# Patient Record
Sex: Female | Born: 2000 | Race: Black or African American | Hispanic: No | Marital: Single | Smoking: Never smoker
Health system: Southern US, Community
[De-identification: ages and names within clinical notes are randomized; demographics above are authoritative.]

## PROBLEM LIST (undated history)

## (undated) ENCOUNTER — Inpatient Hospital Stay (HOSPITAL_COMMUNITY): Payer: Self-pay

## (undated) DIAGNOSIS — R519 Headache, unspecified: Secondary | ICD-10-CM

---
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
## 2019-07-07 ENCOUNTER — Ambulatory Visit
Admission: EM | Admit: 2019-07-07 | Discharge: 2019-07-07 | Disposition: A | Discharge status: Home / Self Care | Payer: Managed Care, Other (non HMO) | Attending: Emergency Medicine | Admitting: Emergency Medicine

## 2019-07-07 ENCOUNTER — Encounter: Payer: Self-pay | Admitting: Emergency Medicine

## 2019-07-07 ENCOUNTER — Other Ambulatory Visit: Payer: Self-pay

## 2019-07-07 DIAGNOSIS — B354 Tinea corporis: Secondary | ICD-10-CM | POA: Diagnosis not present

## 2019-07-07 MED ORDER — CICLOPIROX OLAMINE 0.77 % EX CREA: TOPICAL_CREAM | Freq: Two times a day (BID) | CUTANEOUS | 0 refills | Status: DC

## 2019-07-07 NOTE — Discharge Instructions (Addendum)
Prescribed ciclopirox.  Use twice daily until symptoms resolution Use mild soap daily.  Avoid soaps with additives, or perfumes Follow up with PCP Return or go to the ED if you have any new or worsening symptoms such as worsening rash, pain, fever, chills, nausea, vomiting, abdominal pain, etc..Marland Kitchen

## 2019-07-07 NOTE — ED Triage Notes (Signed)
Area to RT forearm that is raised, itching and burning since Saturday. Pt has used cortisone cream with no relief.

## 2019-07-07 NOTE — ED Provider Notes (Signed)
[facility]    CSN: [id number] Arrival date & time: 07/07/19  1050      History   Chief Complaint Chief Complaint  Patient presents with  . Rash    HPI Monique Duran is a 19 y.o. female.   Who presented to the urgent care for complaint of rash on right forearm for the past 2 days.  She denies changes in soaps, detergents, or anyone with similar symptoms.  She localizes the rash to her right forearm.  She describes it as itchy and burning.  She has tried OTC hydrocortisone without relief.  Her symptoms are made worse with heat. denies similar symptoms in the past.  Denies chills, fever, nausea, vomiting, diarrhea  The history is provided by the patient. No language interpreter was used.  Rash   History reviewed. No pertinent past medical history.  There are no problems to display for this patient.   History reviewed. No pertinent surgical history.  OB History   No obstetric history on file.      Home Medications    Prior to Admission medications   Medication Sig Start Date End Date Taking? Authorizing Provider  cyclobenzaprine (FLEXERIL) 10 MG tablet Take 1 tablet (10 mg total) by mouth 2 (two) times daily as needed for muscle spasms. 04/11/18  Yes Robinson, [place] N, PA-C  ciclopirox (LOPROX) 0.77 % cream Apply topically 2 (two) times daily. 07/07/19   Taelon Bendorf, Zachery Dakins, FNP    Family History No family history on file.  Social History Social History   Tobacco Use  . Smoking status: Never Smoker  . Smokeless tobacco: Never Used  Substance Use Topics  . Alcohol use: Never  . Drug use: Never     Allergies   Patient has no known allergies.   Review of Systems Review of Systems  Constitutional: Negative.   Respiratory: Negative.   Cardiovascular: Negative.   Skin: Positive for color change and rash.  All other systems reviewed and are negative.    Physical Exam Triage Vital Signs ED Triage Vitals [07/07/19 1059]  Enc Vitals Group     BP      Pulse      Resp      Temp      Temp src      SpO2      Weight 110 lb (49.9 kg)     Height 5\' 2"  (1.575 m)     Head Circumference      Peak Flow      Pain Score 0     Pain Loc      Pain Edu?      Excl. in GC?    No data found.  Updated Vital Signs BP 124/88 (BP Location: Right Arm)   Pulse (!) 105   Temp 98.4 F (36.9 C) (Oral)   Resp 17   Ht 5\' 2"  (1.575 m)   Wt 110 lb (49.9 kg)   LMP 05/07/2019   SpO2 96%   BMI 20.12 kg/m   Visual Acuity Right Eye Distance:   Left Eye Distance:   Bilateral Distance:    Right Eye Near:   Left Eye Near:    Bilateral Near:     Physical Exam Vitals and nursing note reviewed.  Constitutional:      General: She is not in acute distress.    Appearance: Normal appearance. She is normal weight. She is not ill-appearing, toxic-appearing or diaphoretic.  Cardiovascular:     Rate and Rhythm:  Normal rate and regular rhythm.     Pulses: Normal pulses.     Heart sounds: Normal heart sounds. No murmur. No friction rub. No gallop.   Pulmonary:     Effort: Pulmonary effort is normal. No respiratory distress.     Breath sounds: Normal breath sounds. No stridor. No wheezing, rhonchi or rales.  Chest:     Chest wall: No tenderness.  Skin:    Findings: Rash present. Rash is crusting and vesicular.  Neurological:     Mental Status: She is alert.      UC Treatments / Results  Labs (all labs ordered are listed, but only abnormal results are displayed) Labs Reviewed - No data to display  EKG   Radiology No results found.  Procedures Procedures (including critical care time)  Medications Ordered in UC Medications - No data to display  Initial Impression / Assessment and Plan / UC Course  I have reviewed the triage vital signs and the nursing notes.  Pertinent labs & imaging results that were available during my care of the patient were reviewed by me and considered in my medical decision making (see chart for  details).   Patient is stable at discharge.  Symptom is likely from tinea corporis.  Will prescribe ciclopirox.  Was advised to follow PCP.  Final Clinical Impressions(s) / UC Diagnoses   Final diagnoses:  Tinea corporis     Discharge Instructions     Prescribed ciclopirox.  Use twice daily until symptoms resolution Use mild soap daily.  Avoid soaps with additives, or perfumes Follow up with PCP Return or go to the ED if you have any new or worsening symptoms such as worsening rash, pain, fever, chills, nausea, vomiting, abdominal pain, etc...     ED Prescriptions    Medication Sig Dispense Auth. Provider   ciclopirox (LOPROX) 0.77 % cream Apply topically 2 (two) times daily. 30 g Emerson Monte, FNP     PDMP not reviewed this encounter.   Emerson Monte, FNP 07/07/19 1119

## 2020-01-13 ENCOUNTER — Emergency Department (HOSPITAL_COMMUNITY)
Admission: EM | Admit: 2020-01-13 | Discharge: 2020-01-14 | Disposition: A | Discharge status: Home / Self Care | Payer: Managed Care, Other (non HMO) | Attending: Emergency Medicine | Admitting: Emergency Medicine

## 2020-01-13 ENCOUNTER — Other Ambulatory Visit: Payer: Self-pay

## 2020-01-13 ENCOUNTER — Encounter (HOSPITAL_COMMUNITY): Payer: Self-pay | Admitting: Emergency Medicine

## 2020-01-13 DIAGNOSIS — R6883 Chills (without fever): Secondary | ICD-10-CM | POA: Insufficient documentation

## 2020-01-13 DIAGNOSIS — R519 Headache, unspecified: Secondary | ICD-10-CM | POA: Insufficient documentation

## 2020-01-13 DIAGNOSIS — Z5321 Procedure and treatment not carried out due to patient leaving prior to being seen by health care provider: Secondary | ICD-10-CM | POA: Diagnosis not present

## 2020-01-13 DIAGNOSIS — R630 Anorexia: Secondary | ICD-10-CM | POA: Insufficient documentation

## 2020-01-13 NOTE — ED Triage Notes (Signed)
Pt states she has had headache x 5 days, loss of appetite x 2 days and has had chills at home. Hx of migraines. Alert and oriented.

## 2020-02-06 NOTE — L&D Delivery Note (Signed)
Delivery Note Patient was admitted with spontaneous rupture of membranes.  She was started on pitocin.  She entered active labor and quickly progressed to 10 cm.  She pushed well for 45 minutes.  At 8:49 AM a viable female was delivered via Vaginal, Spontaneous (Presentation: Left Occiput Anterior).  APGAR: 9, 9; weight 5 lb 9.8 oz (2546 g).   Placenta status: Spontaneous, Intact.  Cord: 3 vessels with the following complications: None.  Cord pH: n/a  Anesthesia: Epidural Episiotomy: None Lacerations: Vaginal Suture Repair: 2.0 vicryl rapide repaired with figure of eight Est. Blood Loss (mL): 50  At approximately 0805, patient was noted to have a temperature of 100.22F as well as new onset fetal and maternal tachycardia.  She received 1gm acetaminophen.  Repeat temperature 30 minutes later was still elevated.  She received 2gm of ampicillin for chorioamnionitis, but did not receive gentamicin due to delivery shortly after diagnosis.    Mom to postpartum.  Baby to Couplet care / Skin to Skin.  Mariano Doshi GEFFEL Marguetta Windish 11/23/2020, 10:25 AM

## 2020-05-04 LAB — OB RESULTS CONSOLE GC/CHLAMYDIA
Chlamydia: NEGATIVE
Gonorrhea: NEGATIVE

## 2020-05-04 LAB — OB RESULTS CONSOLE ABO/RH: RH Type: POSITIVE

## 2020-05-04 LAB — OB RESULTS CONSOLE HIV ANTIBODY (ROUTINE TESTING): HIV: NONREACTIVE

## 2020-05-04 LAB — OB RESULTS CONSOLE HEPATITIS B SURFACE ANTIGEN: Hepatitis B Surface Ag: NEGATIVE

## 2020-05-04 LAB — OB RESULTS CONSOLE VARICELLA ZOSTER ANTIBODY, IGG: Varicella: IMMUNE

## 2020-05-04 LAB — OB RESULTS CONSOLE ANTIBODY SCREEN: Antibody Screen: NEGATIVE

## 2020-05-04 LAB — OB RESULTS CONSOLE RPR: RPR: NONREACTIVE

## 2020-05-04 LAB — OB RESULTS CONSOLE RUBELLA ANTIBODY, IGM: Rubella: IMMUNE

## 2020-05-11 ENCOUNTER — Telehealth: Payer: Self-pay | Admitting: Physician Assistant

## 2020-05-11 NOTE — Telephone Encounter (Signed)
Received a new hem referral from Dr. Mindi Slicker for abnormal blood chemistry. Ms. Monique Duran has been cld and scheduled to seeIrene on 4/11 at 9am. Pt aware to arrive 20 minutes early.

## 2020-05-16 ENCOUNTER — Other Ambulatory Visit: Payer: Self-pay

## 2020-05-16 ENCOUNTER — Inpatient Hospital Stay: Payer: Managed Care, Other (non HMO) | Attending: Physician Assistant

## 2020-05-16 ENCOUNTER — Telehealth: Payer: Self-pay | Admitting: Physician Assistant

## 2020-05-16 ENCOUNTER — Encounter: Payer: Self-pay | Admitting: Physician Assistant

## 2020-05-16 ENCOUNTER — Inpatient Hospital Stay (HOSPITAL_BASED_OUTPATIENT_CLINIC_OR_DEPARTMENT_OTHER): Payer: Managed Care, Other (non HMO) | Admitting: Physician Assistant

## 2020-05-16 VITALS — BP 117/67 | HR 73 | Temp 97.9°F | Resp 15 | Ht 63.0 in | Wt 108.7 lb

## 2020-05-16 DIAGNOSIS — O26891 Other specified pregnancy related conditions, first trimester: Secondary | ICD-10-CM

## 2020-05-16 DIAGNOSIS — Z3A11 11 weeks gestation of pregnancy: Secondary | ICD-10-CM

## 2020-05-16 DIAGNOSIS — D563 Thalassemia minor: Secondary | ICD-10-CM

## 2020-05-16 DIAGNOSIS — K068 Other specified disorders of gingiva and edentulous alveolar ridge: Secondary | ICD-10-CM | POA: Insufficient documentation

## 2020-05-16 DIAGNOSIS — D582 Other hemoglobinopathies: Secondary | ICD-10-CM

## 2020-05-16 LAB — PROTIME-INR
INR: 1.1 (ref 0.8–1.2)
Prothrombin Time: 13.3 seconds (ref 11.4–15.2)

## 2020-05-16 LAB — APTT: aPTT: 30 seconds (ref 24–36)

## 2020-05-16 NOTE — Telephone Encounter (Signed)
I called Monique Duran to reviewed the lab results from today, 05/16/2020.  PT/INR and PTT levels were all within normal limits.  No further work-up is recommended at this time.  Advised patient to try a soft bristle toothbrush and follow-up with dentist to evaluate for gingivitis.   Patient expressed understanding and satisfaction with the plan provided.

## 2020-05-16 NOTE — Progress Notes (Signed)
[facility] [phone]   [phone]  INITIAL CONSULT NOTE  Patient Care Team: Patient, No Pcp Per (Inactive) as PCP - General (General Practice)  Hematological/Oncological History 1) 05/09/2020: Labs from OG/GYN, DR. Cecilia Banga.  -Hgb F 0.0%, Hgb A 96.6%, Hgb A2 3.4% (H), Hgb S 0.0% -WBC 6.6, Hgb 12.7, MCV 89, Plt 356  2) 05/16/2020: Establish care with [name] PA-C  CHIEF COMPLAINTS/PURPOSE OF CONSULTATION:  "Beta Thalassemia Minor"  HISTORY OF PRESENTING ILLNESS:  Monique Duran 20 y.o. 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 Patient is unaccompanied for this visit.  Patient reports that she is fatigued but attributes this to her pregnancy, working full-time and taking evening classes.  She does take a nap daily with improvement of her energy levels.  Patient reports that her appetite varies but denies any significant weight changes.  She endorses intermittent episodes of nausea without vomiting.  Patient denies any bowel habit changes or abdominal pain.  Patient denies any hematochezia or melena.  Over the last 3 to 4 months, patient reports gum bleeding after brushing.  She has not gone to the dentist for over a year due to COVID pandemic.  She denies any dental pain or signs of infection.  Patient reports mild shortness of breath with exertion but none at rest.  Patient denies any fevers, chills, night sweats, chest pain or cough.  She has no other complaints.  Rest of the 10 point ROS is below.   MEDICAL HISTORY:  History reviewed. No pertinent past medical history.  SURGICAL HISTORY: History reviewed. No pertinent surgical history.  SOCIAL HISTORY: Social History   Socioeconomic History  . Marital status: Single    Spouse name: Not on file  . Number of children: Not on file  . Years of education: Not on file  . Highest education level: Not on file  Occupational History  .  Not on file  Tobacco Use  . Smoking status: Never Smoker  . Smokeless tobacco: Never Used  Substance and Sexual Activity  . Alcohol use: Never  . Drug use: Never  . Sexual activity: Not on file  Other Topics Concern  . Not on file  Social History Narrative  . Not on file   Social Determinants of Health   Financial Resource Strain: Not on file  Food Insecurity: Not on file  Transportation Needs: Not on file  Physical Activity: Not on file  Stress: Not on file  Social Connections: Not on file  Intimate Partner Violence: Not on file    FAMILY HISTORY: Family History  Problem Relation Age of Onset  . Ovarian cancer Mother   . Cancer Maternal Grandmother     ALLERGIES:  has No Known Allergies.  MEDICATIONS:  Current Outpatient Medications  Medication Sig Dispense Refill  . Prenatal MV & Min w/FA-DHA (PRENATAL GUMMIES PO) Take 2 Doses by mouth daily.     No current facility-administered medications for this visit.    REVIEW OF SYSTEMS:   Constitutional: ( - ) fevers, ( - )  chills , ( - ) night sweats Eyes: ( - ) blurriness of vision, ( - ) double vision, ( - ) watery eyes Ears, nose, mouth, throat, and face: ( - ) mucositis, ( - ) sore throat Respiratory: ( - ) cough, ( - ) dyspnea, ( - ) wheezes Cardiovascular: ( - ) palpitation, ( - ) chest discomfort, ( - ) lower  extremity swelling Gastrointestinal:  ( + ) nausea, ( - ) heartburn, ( - ) change in bowel habits Skin: ( - ) abnormal skin rashes Lymphatics: ( - ) new lymphadenopathy, ( - ) easy bruising Neurological: ( - ) numbness, ( - ) tingling, ( - ) new weaknesses Behavioral/Psych: ( - ) mood change, ( - ) new changes  All other systems were reviewed with the patient and are negative.  PHYSICAL EXAMINATION: ECOG PERFORMANCE STATUS: 0 - Asymptomatic  Vitals:   05/16/20 0900  BP: 117/67  Pulse: 73  Resp: 15  Temp: 97.9 F (36.6 C)  SpO2: 100%   Filed Weights   05/16/20 0900  Weight: 108 lb 11.2 oz  (49.3 kg)    GENERAL: well appearing african [place] female in NAD  SKIN: skin color, texture, turgor are normal, no rashes or significant lesions EYES: conjunctiva are pink and non-injected, sclera clear OROPHARYNX: no exudate, no erythema; lips, buccal mucosa, and tongue normal  NECK: supple, non-tender LYMPH:  no palpable lymphadenopathy in the cervical, axillary or supraclavicular lymph nodes.  LUNGS: clear to auscultation and percussion with normal breathing effort HEART: regular rate & rhythm and no murmurs and no lower extremity edema ABDOMEN: soft, non-tender, non-distended, normal bowel sounds Musculoskeletal: no cyanosis of digits and no clubbing  PSYCH: alert & oriented x 3, fluent speech NEURO: no focal motor/sensory deficits  LABORATORY DATA:  I have reviewed the data as listed No flowsheet data found.  No flowsheet data found.  ASSESSMENT & PLAN Monique Duran is a 20 y.o. 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 Patient is asymptomatic and most recent lab work from 05/09/2020 doesn't indicate anemia. Patient denies history of anemia or other blood disorders. I explained that no further intervention is recommended at this time and patient will likely be asymptomatic for the rest of her life. Since levels are borderline, patient can repeat hemoglobin electrophoresis 3 months after delivery to confirm beta thalassemia trait. Baby's father is not aware of having a thalassemia disorder. I recommend testing the baby to evaluate for thalassemia after delivery. In addition, consider testing the baby's father for future pregnancies.  #Borderline Hemoglobin A2 levels: --Possible beta thalassemia trait. No further workup at this time. Recommend repeat testing 3 months after delivery. If confirmed beta thalassemia trait, consider testing baby after delivery. Recommend testing baby's father for  future pregnancies.  --RTC as needed.   #Gum bleeding --Differentials includes gingivitis, trauma from toothbrush and/or bleeding disorder. --Labs today to check PT/INR and PTT levels today. Follow up by phone with results. --Advised to follow up with dentist and try a soft bristle toothbrush.     Orders Placed This Encounter  Procedures  . Protime-INR    Standing Status:   Future    Standing Expiration Date:   05/16/2021  . APTT    Standing Status:   Future    Standing Expiration Date:   05/16/2021    All questions were answered. The patient knows to call the clinic with any problems, questions or concerns.  A total of more than 60 minutes were spent on this encounter and over half of that time was spent on counseling and coordination of care as outlined above.    Briant Cedar, PA-C Department of Hematology/Oncology [facility] Phone: [phone]  Patient was seen with Dr. Leonides Schanz  I have read the above  note and personally examined the patient. I agree with the assessment and plan as noted above.  Briefly Mrs. Choung is a 20 year old female currently [redacted] weeks pregnant who presents for evaluation of an elevated hemoglobin A2 on a hemoglobin electrophoresis.  She was noted to have a hemoglobin A2 of approximately 3.4% which is elevated above the reference range.  This is suggestive of a possible beta thalassemia trait.  It is unlikely that the pregnancy has altered the hemoglobin A2, however we would recommend repeat evaluation approximately 3 months after delivery.  In the event that this is genuine there is no specific intervention is required for this patient, other than we would recommend genetic counseling prior to having another child.  It may also be worthwhile to have the child checked for beta thalassemia trait after birth if confirmed in the mother.  The patient does not have any other clinical signs concerning for beta thalassemia  trait with no decrease in the MCV or decreased hemoglobin.  No routine follow-up is required in our clinic, however we are happy to see her back in the event that she were to have new or other concerning findings on her blood work.   Ulysees Barns, MD Department of Hematology/Oncology [facility] at [facility] Phone: [phone] Pager: [phone] Email: [email or web address]

## 2020-07-08 ENCOUNTER — Inpatient Hospital Stay (HOSPITAL_COMMUNITY)
Admission: AD | Admit: 2020-07-08 | Discharge: 2020-07-08 | Disposition: A | Discharge status: Home / Self Care | Payer: Managed Care, Other (non HMO) | Attending: Obstetrics and Gynecology | Admitting: Obstetrics and Gynecology

## 2020-07-08 ENCOUNTER — Encounter (HOSPITAL_COMMUNITY): Payer: Self-pay

## 2020-07-08 ENCOUNTER — Inpatient Hospital Stay (HOSPITAL_BASED_OUTPATIENT_CLINIC_OR_DEPARTMENT_OTHER): Payer: Managed Care, Other (non HMO)

## 2020-07-08 ENCOUNTER — Other Ambulatory Visit: Payer: Self-pay

## 2020-07-08 DIAGNOSIS — Z3A18 18 weeks gestation of pregnancy: Secondary | ICD-10-CM | POA: Diagnosis not present

## 2020-07-08 DIAGNOSIS — O9A312 Physical abuse complicating pregnancy, second trimester: Secondary | ICD-10-CM

## 2020-07-08 DIAGNOSIS — O9A212 Injury, poisoning and certain other consequences of external causes complicating pregnancy, second trimester: Secondary | ICD-10-CM | POA: Diagnosis not present

## 2020-07-08 DIAGNOSIS — M549 Dorsalgia, unspecified: Secondary | ICD-10-CM | POA: Diagnosis not present

## 2020-07-08 DIAGNOSIS — R109 Unspecified abdominal pain: Secondary | ICD-10-CM | POA: Diagnosis not present

## 2020-07-08 DIAGNOSIS — Z6981 Encounter for mental health services for victim of other abuse: Secondary | ICD-10-CM

## 2020-07-08 DIAGNOSIS — R1031 Right lower quadrant pain: Secondary | ICD-10-CM | POA: Diagnosis not present

## 2020-07-08 DIAGNOSIS — T7491XA Unspecified adult maltreatment, confirmed, initial encounter: Secondary | ICD-10-CM

## 2020-07-08 DIAGNOSIS — O26892 Other specified pregnancy related conditions, second trimester: Secondary | ICD-10-CM | POA: Diagnosis not present

## 2020-07-08 HISTORY — DX: Headache, unspecified: R51.9

## 2020-07-08 LAB — WET PREP, GENITAL
Sperm: NONE SEEN
Trich, Wet Prep: NONE SEEN
Yeast Wet Prep HPF POC: NONE SEEN

## 2020-07-08 LAB — CBC
HCT: 31.2 % — ABNORMAL LOW (ref 36.0–46.0)
Hemoglobin: 10.5 g/dL — ABNORMAL LOW (ref 12.0–15.0)
MCH: 30.7 pg (ref 26.0–34.0)
MCHC: 33.7 g/dL (ref 30.0–36.0)
MCV: 91.2 fL (ref 80.0–100.0)
Platelets: 266 10*3/uL (ref 150–400)
RBC: 3.42 MIL/uL — ABNORMAL LOW (ref 3.87–5.11)
RDW: 13.1 % (ref 11.5–15.5)
WBC: 6.4 10*3/uL (ref 4.0–10.5)
nRBC: 0 % (ref 0.0–0.2)

## 2020-07-08 LAB — URINALYSIS, ROUTINE W REFLEX MICROSCOPIC
Bilirubin Urine: NEGATIVE
Glucose, UA: NEGATIVE mg/dL
Hgb urine dipstick: NEGATIVE
Ketones, ur: 5 mg/dL — AB
Leukocytes,Ua: NEGATIVE
Nitrite: NEGATIVE
Protein, ur: NEGATIVE mg/dL
Specific Gravity, Urine: 1.025 (ref 1.005–1.030)
pH: 5 (ref 5.0–8.0)

## 2020-07-08 MED ORDER — ACETAMINOPHEN 500 MG PO TABS
1000.0000 mg | ORAL_TABLET | Freq: Once | ORAL | Status: AC
  Administered 2020-07-08: 1000 mg via ORAL
  Filled 2020-07-08: qty 2

## 2020-07-08 NOTE — MAU Note (Signed)
.  Monique Duran is a 20 y.o. at [redacted]w[redacted]d here in MAU reporting: Abdominal pain that is her LRQ. States that it started this morning when she woke up. Denies VB or LOF. The pain feels better when she sits down but returns to a 7 when she stands,   Pain score: 7   Lab orders placed from triage: UA
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
## 2020-07-08 NOTE — Discharge Instructions (Signed)
Abdominal Pain During Pregnancy Belly (abdominal) pain is common during pregnancy. There are many possible causes. Some causes are more serious than others. Sometimes the cause is not known. Always tell your doctor if you have belly pain. Follow these instructions at home:  Do not have sex or put anything in your vagina until your pain goes away completely.  Get plenty of rest until your pain gets better.  Drink enough fluid to keep your pee (urine) pale yellow.  Take over-the-counter and prescription medicines only as told by your doctor.  Keep all follow-up visits.   Contact a doctor if:  You keep having pain after resting.  Your pain gets worse after resting.  You have lower belly pain that: ? Comes and goes at regular times. ? Spreads to your back. ? Feels like menstrual cramps.  You have pain or burning when you pee (urinate). Get help right away if:  You have a fever or chills.  You feel like it is hard to breathe.  You have bleeding from your vagina.  You are leaking fluid or tissue from your vagina.  You vomit for more than 24 hours.  You have watery poop (diarrhea) for more than 24 hours.  Your baby is moving less than usual.  You feel very weak or faint.  You have very bad pain in your upper belly. Summary  Belly pain is common during pregnancy. There are many possible causes.  If you have belly pain during pregnancy, tell your doctor right away.  Keep all follow-up visits. This information is not intended to replace advice given to you by your health care provider. Make sure you discuss any questions you have with your health care provider. Document Revised: 10/06/2019 Document Reviewed: 10/06/2019 Elsevier Patient Education  2021 Elsevier Inc.  

## 2020-07-11 LAB — GC/CHLAMYDIA PROBE AMP (~~LOC~~) NOT AT ARMC
Chlamydia: NEGATIVE
Comment: NEGATIVE
Comment: NORMAL
Neisseria Gonorrhea: NEGATIVE

## 2020-11-09 LAB — OB RESULTS CONSOLE GBS: GBS: NEGATIVE

## 2020-11-22 ENCOUNTER — Inpatient Hospital Stay (HOSPITAL_COMMUNITY)
Admission: AD | Admit: 2020-11-22 | Discharge: 2020-11-25 | DRG: 805 | Disposition: A | Discharge status: Home / Self Care | Payer: Managed Care, Other (non HMO) | Attending: Obstetrics | Admitting: Obstetrics

## 2020-11-22 ENCOUNTER — Other Ambulatory Visit: Payer: Self-pay

## 2020-11-22 ENCOUNTER — Encounter (HOSPITAL_COMMUNITY): Payer: Self-pay | Admitting: Obstetrics and Gynecology

## 2020-11-22 DIAGNOSIS — Z20822 Contact with and (suspected) exposure to covid-19: Secondary | ICD-10-CM | POA: Diagnosis present

## 2020-11-22 DIAGNOSIS — R000 Tachycardia, unspecified: Secondary | ICD-10-CM | POA: Diagnosis present

## 2020-11-22 DIAGNOSIS — Z3A38 38 weeks gestation of pregnancy: Secondary | ICD-10-CM | POA: Diagnosis not present

## 2020-11-22 DIAGNOSIS — O365930 Maternal care for other known or suspected poor fetal growth, third trimester, not applicable or unspecified: Secondary | ICD-10-CM | POA: Diagnosis present

## 2020-11-22 DIAGNOSIS — O411230 Chorioamnionitis, third trimester, not applicable or unspecified: Secondary | ICD-10-CM | POA: Diagnosis present

## 2020-11-22 DIAGNOSIS — O99892 Other specified diseases and conditions complicating childbirth: Secondary | ICD-10-CM | POA: Diagnosis present

## 2020-11-22 DIAGNOSIS — O26893 Other specified pregnancy related conditions, third trimester: Secondary | ICD-10-CM | POA: Diagnosis present

## 2020-11-22 LAB — CBC
HCT: 34.5 % — ABNORMAL LOW (ref 36.0–46.0)
Hemoglobin: 11.5 g/dL — ABNORMAL LOW (ref 12.0–15.0)
MCH: 29.7 pg (ref 26.0–34.0)
MCHC: 33.3 g/dL (ref 30.0–36.0)
MCV: 89.1 fL (ref 80.0–100.0)
Platelets: 329 10*3/uL (ref 150–400)
RBC: 3.87 MIL/uL (ref 3.87–5.11)
RDW: 13.7 % (ref 11.5–15.5)
WBC: 8.3 10*3/uL (ref 4.0–10.5)
nRBC: 0 % (ref 0.0–0.2)

## 2020-11-22 MED ORDER — ACETAMINOPHEN 325 MG PO TABS: 650.0000 mg | ORAL_TABLET | ORAL | Status: DC | PRN

## 2020-11-22 MED ORDER — LIDOCAINE HCL (PF) 1 % IJ SOLN: 30.0000 mL | INTRAMUSCULAR | Status: DC | PRN

## 2020-11-22 MED ORDER — OXYTOCIN-SODIUM CHLORIDE 30-0.9 UT/500ML-% IV SOLN: 2.5000 [IU]/h | INTRAVENOUS | Status: DC

## 2020-11-22 MED ORDER — OXYCODONE-ACETAMINOPHEN 5-325 MG PO TABS
1.0000 | ORAL_TABLET | ORAL | Status: DC | PRN
Start: 2020-11-22 — End: 2020-11-23

## 2020-11-22 MED ORDER — OXYTOCIN BOLUS FROM INFUSION
333.0000 mL | Freq: Once | INTRAVENOUS | Status: AC
  Administered 2020-11-23: 333 mL via INTRAVENOUS

## 2020-11-22 MED ORDER — FLEET ENEMA 7-19 GM/118ML RE ENEM: 1.0000 | ENEMA | RECTAL | Status: DC | PRN

## 2020-11-22 MED ORDER — LACTATED RINGERS IV SOLN
500.0000 mL | INTRAVENOUS | Status: DC | PRN
Start: 2020-11-22 — End: 2020-11-23
  Administered 2020-11-23: 500 mL via INTRAVENOUS

## 2020-11-22 MED ORDER — LACTATED RINGERS IV SOLN: INTRAVENOUS | Status: DC

## 2020-11-22 MED ORDER — ONDANSETRON HCL 4 MG/2ML IJ SOLN
4.0000 mg | Freq: Four times a day (QID) | INTRAMUSCULAR | Status: DC | PRN
  Administered 2020-11-23: 4 mg via INTRAVENOUS
  Filled 2020-11-22: qty 2

## 2020-11-22 MED ORDER — OXYCODONE-ACETAMINOPHEN 5-325 MG PO TABS: 2.0000 | ORAL_TABLET | ORAL | Status: DC | PRN

## 2020-11-22 MED ORDER — SOD CITRATE-CITRIC ACID 500-334 MG/5ML PO SOLN: 30.0000 mL | ORAL | Status: DC | PRN
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
## 2020-11-22 NOTE — MAU Note (Signed)
Pt reported leakage of fluid @2126  today, clear in color, no odor, denies vaginal bleeding, + FM, safety maintained.

## 2020-11-23 ENCOUNTER — Inpatient Hospital Stay (HOSPITAL_COMMUNITY): Payer: Managed Care, Other (non HMO) | Admitting: Anesthesiology

## 2020-11-23 ENCOUNTER — Encounter (HOSPITAL_COMMUNITY): Payer: Self-pay | Admitting: Obstetrics and Gynecology

## 2020-11-23 LAB — RESP PANEL BY RT-PCR (FLU A&B, COVID) ARPGX2
Influenza A by PCR: NEGATIVE
Influenza B by PCR: NEGATIVE
SARS Coronavirus 2 by RT PCR: NEGATIVE

## 2020-11-23 LAB — TYPE AND SCREEN
ABO/RH(D): A POS
Antibody Screen: NEGATIVE

## 2020-11-23 LAB — RPR: RPR Ser Ql: NONREACTIVE

## 2020-11-23 MED ORDER — TETANUS-DIPHTH-ACELL PERTUSSIS 5-2.5-18.5 LF-MCG/0.5 IM SUSY: 0.5000 mL | PREFILLED_SYRINGE | Freq: Once | INTRAMUSCULAR | Status: DC

## 2020-11-23 MED ORDER — IBUPROFEN 600 MG PO TABS
600.0000 mg | ORAL_TABLET | Freq: Four times a day (QID) | ORAL | Status: DC
  Administered 2020-11-23 – 2020-11-25 (×9): 600 mg via ORAL
  Filled 2020-11-23 (×9): qty 1

## 2020-11-23 MED ORDER — ACETAMINOPHEN 325 MG PO TABS: 650.0000 mg | ORAL_TABLET | ORAL | Status: DC | PRN

## 2020-11-23 MED ORDER — FENTANYL-BUPIVACAINE-NACL 0.5-0.125-0.9 MG/250ML-% EP SOLN
12.0000 mL/h | EPIDURAL | Status: DC | PRN
  Administered 2020-11-23: 12 mL/h via EPIDURAL
  Filled 2020-11-23: qty 250

## 2020-11-23 MED ORDER — LACTATED RINGERS IV SOLN: 500.0000 mL | Freq: Once | INTRAVENOUS | Status: DC

## 2020-11-23 MED ORDER — SENNOSIDES-DOCUSATE SODIUM 8.6-50 MG PO TABS
2.0000 | ORAL_TABLET | ORAL | Status: DC
  Administered 2020-11-23 – 2020-11-25 (×3): 2 via ORAL
  Filled 2020-11-23 (×3): qty 2

## 2020-11-23 MED ORDER — ACETAMINOPHEN 500 MG PO TABS
1000.0000 mg | ORAL_TABLET | Freq: Once | ORAL | Status: AC
  Administered 2020-11-23: 1000 mg via ORAL
  Filled 2020-11-23: qty 2

## 2020-11-23 MED ORDER — OXYTOCIN-SODIUM CHLORIDE 30-0.9 UT/500ML-% IV SOLN
1.0000 m[IU]/min | INTRAVENOUS | Status: DC
  Administered 2020-11-23: 2 m[IU]/min via INTRAVENOUS
  Filled 2020-11-23: qty 500

## 2020-11-23 MED ORDER — PHENYLEPHRINE 40 MCG/ML (10ML) SYRINGE FOR IV PUSH (FOR BLOOD PRESSURE SUPPORT)
80.0000 ug | PREFILLED_SYRINGE | INTRAVENOUS | Status: DC | PRN
  Filled 2020-11-23: qty 10

## 2020-11-23 MED ORDER — OXYCODONE HCL 5 MG PO TABS: 5.0000 mg | ORAL_TABLET | ORAL | Status: DC | PRN

## 2020-11-23 MED ORDER — DIPHENHYDRAMINE HCL 50 MG/ML IJ SOLN
12.5000 mg | INTRAMUSCULAR | Status: DC | PRN
Start: 2020-11-23 — End: 2020-11-23

## 2020-11-23 MED ORDER — BENZOCAINE-MENTHOL 20-0.5 % EX AERO: 1.0000 "application " | INHALATION_SPRAY | CUTANEOUS | Status: DC | PRN

## 2020-11-23 MED ORDER — TERBUTALINE SULFATE 1 MG/ML IJ SOLN: 0.2500 mg | Freq: Once | INTRAMUSCULAR | Status: DC | PRN

## 2020-11-23 MED ORDER — SODIUM CHLORIDE 0.9 % IV SOLN
2.0000 g | Freq: Once | INTRAVENOUS | Status: AC
  Administered 2020-11-23: 2 g via INTRAVENOUS
  Filled 2020-11-23: qty 2000

## 2020-11-23 MED ORDER — WITCH HAZEL-GLYCERIN EX PADS: 1.0000 "application " | MEDICATED_PAD | CUTANEOUS | Status: DC | PRN

## 2020-11-23 MED ORDER — LIDOCAINE HCL (PF) 1 % IJ SOLN
INTRAMUSCULAR | Status: DC | PRN
  Administered 2020-11-23: 10 mL via EPIDURAL

## 2020-11-23 MED ORDER — BUTORPHANOL TARTRATE 1 MG/ML IJ SOLN
1.0000 mg | INTRAMUSCULAR | Status: DC | PRN
  Administered 2020-11-23: 1 mg via INTRAVENOUS
  Filled 2020-11-23: qty 1

## 2020-11-23 MED ORDER — SIMETHICONE 80 MG PO CHEW: 80.0000 mg | CHEWABLE_TABLET | ORAL | Status: DC | PRN

## 2020-11-23 MED ORDER — DIPHENHYDRAMINE HCL 25 MG PO CAPS: 25.0000 mg | ORAL_CAPSULE | Freq: Four times a day (QID) | ORAL | Status: DC | PRN

## 2020-11-23 MED ORDER — OXYCODONE HCL 5 MG PO TABS: 10.0000 mg | ORAL_TABLET | ORAL | Status: DC | PRN

## 2020-11-23 MED ORDER — EPHEDRINE 5 MG/ML INJ: 10.0000 mg | INTRAVENOUS | Status: DC | PRN

## 2020-11-23 MED ORDER — COCONUT OIL OIL: 1.0000 "application " | TOPICAL_OIL | Status: DC | PRN

## 2020-11-23 MED ORDER — DIBUCAINE (PERIANAL) 1 % EX OINT: 1.0000 "application " | TOPICAL_OINTMENT | CUTANEOUS | Status: DC | PRN

## 2020-11-23 MED ORDER — PRENATAL MULTIVITAMIN CH
1.0000 | ORAL_TABLET | Freq: Every day | ORAL | Status: DC
  Administered 2020-11-23 – 2020-11-25 (×3): 1 via ORAL
  Filled 2020-11-23 (×3): qty 1

## 2020-11-23 MED ORDER — ONDANSETRON HCL 4 MG PO TABS: 4.0000 mg | ORAL_TABLET | ORAL | Status: DC | PRN

## 2020-11-23 MED ORDER — OXYTOCIN-SODIUM CHLORIDE 30-0.9 UT/500ML-% IV SOLN: 1.0000 m[IU]/min | INTRAVENOUS | Status: DC

## 2020-11-23 MED ORDER — PHENYLEPHRINE 40 MCG/ML (10ML) SYRINGE FOR IV PUSH (FOR BLOOD PRESSURE SUPPORT)
80.0000 ug | PREFILLED_SYRINGE | INTRAVENOUS | Status: DC | PRN
  Administered 2020-11-23: 80 ug via INTRAVENOUS
  Filled 2020-11-23: qty 10

## 2020-11-23 MED ORDER — ONDANSETRON HCL 4 MG/2ML IJ SOLN: 4.0000 mg | INTRAMUSCULAR | Status: DC | PRN

## 2020-11-23 NOTE — Lactation Note (Signed)
This note was copied from a baby's chart. Lactation Consultation Note  Patient Name: Monique Duran Date: 11/23/2020 Reason for consult: Initial assessment;1st time breastfeeding;Early term 37-38.6wks;Infant < 6lbs;Other (Comment) (Low blood sugar, less than 6 lbs at birth.) Age:21 hours LC entered the room, mom was doing skin to skin with infant. Mom's current feeding choice is breast and supplementing with donor breast milk. Mom has flat nipples, mom will attempt latch infant without NS for now, but knows how to use it, if needed.  LC did not observe latch due mom recently breastfeeding for 10 minutes  and  afterwards was giving infant 10 mls of donor breast milk  with yellow slow flow bottle nipple less than 1 hour ago prior to [facility] entering the room. LC set mom up with DEBP and explained how to use. Mom shown how to use DEBP & how to disassemble, clean, & reassemble parts.  Mom made aware of O/P services, breastfeeding support groups, community resources, and our phone # for post-discharge questions.   Mom's current feeding plan: 1- Breastfeeding infant according to feeding cues, 8 to 12+ times within 24 hours, skin to skin. 2- After latching infant at the breast,  mom will supplement infant with her EBM that is pumped or hand express and then offer donor breast milk- Day 1 10 mls per feeding . 3- Mom will continue to use DEBP every 3 hours for 15 minutes on initial setting.  Maternal Data Has patient been taught Hand Expression?: Yes Does the patient have breastfeeding experience prior to this delivery?: Yes  Feeding Mother's Current Feeding Choice: Breast Milk and Donor Milk  LATCH Score Latch: Too sleepy or reluctant, no latch achieved, no sucking elicited.  Audible Swallowing: None  Type of Nipple: Flat  Comfort (Breast/Nipple): Soft / non-tender  Hold (Positioning): No assistance needed to correctly position infant at breast.  LATCH Score: 5   Lactation  Tools Discussed/Used Tools: Pump Breast pump type: Double-Electric Breast Pump Pump Education: Setup, frequency, and cleaning;Milk Storage Reason for Pumping: ETI female infant less than 6 lbs. Pumping frequency: Mom knows to pump every 3 hours for 15 minutes on inital setting.  Interventions Interventions: Breast feeding basics reviewed;Assisted with latch;Skin to skin;Hand express;Education;LC Services brochure  Discharge Pump: Personal;DEBP WIC Program: No  Consult Status Consult Status: Follow-up Date: 11/24/20 Follow-up type: In-patient    Danelle Earthly 11/23/2020, 4:46 PM

## 2020-11-23 NOTE — Lactation Note (Signed)
This note was copied from a baby's chart. Lactation Consultation Note  Patient Name: Girl Shailee Foots ZYSAY'T Date: 11/23/2020 Reason for consult: L&D Initial assessment Age:21 hours  P1, Baby sucking on thumb upon entering. Reviewed hand expression with mother and assisted with latch.  Baby eager, actively sucking. Lactation to follow up on MBU.  Maternal Data Has patient been taught Hand Expression?: Yes Does the patient have breastfeeding experience prior to this delivery?: No  Feeding Mother's Current Feeding Choice: Breast Milk  LATCH Score Latch: Grasps breast easily, tongue down, lips flanged, rhythmical sucking.  Audible Swallowing: A few with stimulation  Type of Nipple: Everted at rest and after stimulation  Comfort (Breast/Nipple): Soft / non-tender  Hold (Positioning): Assistance needed to correctly position infant at breast and maintain latch.  LATCH Score: 8    Interventions Interventions: Assisted with latch;Skin to skin;Hand express;Education  Discharge    Consult Status Consult Status: Follow-up from L&D    Dahlia Byes [facility] 11/23/2020, 9:36 AM

## 2020-11-23 NOTE — Anesthesia Preprocedure Evaluation (Addendum)
Anesthesia Evaluation  Patient identified by MRN, date of birth, ID band Patient awake    Reviewed: Allergy & Precautions, H&P , NPO status , Patient's Chart, lab work & pertinent test results  History of Anesthesia Complications Negative for: history of anesthetic complications  Airway Mallampati: II  TM Distance: >3 FB     Dental   Pulmonary neg pulmonary ROS,    Pulmonary exam normal        Cardiovascular negative cardio ROS   Rhythm:regular Rate:Normal     Neuro/Psych negative neurological ROS  negative psych ROS   GI/Hepatic negative GI ROS, Neg liver ROS,   Endo/Other  negative endocrine ROS  Renal/GU negative Renal ROS  negative genitourinary   Musculoskeletal   Abdominal   Peds  Hematology Beta thalassemia trait Denies history of easy or uncontrolled bleeding   Anesthesia Other Findings   Reproductive/Obstetrics (+) Pregnancy                            Anesthesia Physical Anesthesia Plan  ASA: 2  Anesthesia Plan: Epidural   Post-op Pain Management:    Induction:   PONV Risk Score and Plan:   Airway Management Planned:   Additional Equipment:   Intra-op Plan:   Post-operative Plan:   Informed Consent: I have reviewed the patients History and Physical, chart, labs and discussed the procedure including the risks, benefits and alternatives for the proposed anesthesia with the patient or authorized representative who has indicated his/her understanding and acceptance.       Plan Discussed with:   Anesthesia Plan Comments:         Anesthesia Quick Evaluation

## 2020-11-23 NOTE — Anesthesia Procedure Notes (Signed)
Epidural Patient location during procedure: OB Start time: 11/23/2020 1:06 AM End time: 11/23/2020 1:17 AM  Staffing Anesthesiologist: Lucretia Kern, MD Performed: anesthesiologist   Preanesthetic Checklist Completed: patient identified, IV checked, risks and benefits discussed, monitors and equipment checked, pre-op evaluation and timeout performed  Epidural Patient position: sitting Prep: DuraPrep Patient monitoring: heart rate, continuous pulse ox and blood pressure Approach: midline Location: L3-L4 Injection technique: LOR air  Needle:  Needle type: Tuohy  Needle gauge: 17 G Needle length: 9 cm Needle insertion depth: 5 cm Catheter type: closed end flexible Catheter size: 19 Gauge Catheter at skin depth: 10 cm Test dose: negative  Assessment Events: blood not aspirated, injection not painful, no injection resistance, no paresthesia and negative IV test  Additional Notes Reason for block:procedure for pain
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
## 2020-11-24 ENCOUNTER — Encounter (HOSPITAL_COMMUNITY): Payer: Self-pay | Admitting: Obstetrics and Gynecology

## 2020-11-24 ENCOUNTER — Inpatient Hospital Stay (HOSPITAL_COMMUNITY): Payer: Managed Care, Other (non HMO)

## 2020-11-24 ENCOUNTER — Inpatient Hospital Stay (HOSPITAL_COMMUNITY)
Admission: AD | Admit: 2020-11-24 | Payer: Managed Care, Other (non HMO) | Source: Home / Self Care | Admitting: Obstetrics and Gynecology

## 2020-11-24 LAB — CBC
HCT: 29.1 % — ABNORMAL LOW (ref 36.0–46.0)
Hemoglobin: 9.5 g/dL — ABNORMAL LOW (ref 12.0–15.0)
MCH: 29.1 pg (ref 26.0–34.0)
MCHC: 32.6 g/dL (ref 30.0–36.0)
MCV: 89.3 fL (ref 80.0–100.0)
Platelets: 265 10*3/uL (ref 150–400)
RBC: 3.26 MIL/uL — ABNORMAL LOW (ref 3.87–5.11)
RDW: 13.8 % (ref 11.5–15.5)
WBC: 17.3 10*3/uL — ABNORMAL HIGH (ref 4.0–10.5)
nRBC: 0 % (ref 0.0–0.2)

## 2020-11-24 NOTE — Anesthesia Postprocedure Evaluation (Signed)
Anesthesia Post Note  Patient: Monique Duran  Procedure(s) Performed: AN AD HOC LABOR EPIDURAL     Patient location during evaluation: Mother Baby Anesthesia Type: Epidural Level of consciousness: awake and alert Pain management: pain level controlled Vital Signs Assessment: post-procedure vital signs reviewed and stable Respiratory status: spontaneous breathing, nonlabored ventilation and respiratory function stable Cardiovascular status: stable Postop Assessment: no headache, no backache and epidural receding Anesthetic complications: no   No notable events documented.  Last Vitals:  Vitals:   11/23/20 1955 11/24/20 0557  BP: 107/68 103/84  Pulse: 80 100  Resp: 16 16  Temp: 36.7 C 36.7 C  SpO2: 98% 98%    Last Pain:  Vitals:   11/24/20 0557  TempSrc: Oral  PainSc:    Pain Goal: Patients Stated Pain Goal: 0 (11/23/20 2339)                 Fanny Dance

## 2020-11-24 NOTE — Plan of Care (Signed)
  Problem: Education: Goal: Knowledge of condition will improve Outcome: Adequate for Discharge Goal: Individualized Educational Video(s) Outcome: Adequate for Discharge   Problem: Activity: Goal: Will verbalize the importance of balancing activity with adequate rest periods Outcome: Adequate for Discharge

## 2020-11-24 NOTE — Lactation Note (Signed)
This note was copied from a baby's chart. Lactation Consultation Note  Patient Name: Monique Duran VOJJK'[id number] Date: 11/24/2020 Reason for consult: Follow-up assessment;Mother's request;Difficult latch;Early term 37-38.6wks;Infant < 6lbs;Infant weight loss Age:20 hours  Mom had been alternating between breastfeeding and supplementing with dBM.  We worked on latching getting more depth and with breast compression noting audible swallows.   Plan 1. To feed based on cues 8-12x 24 hr period. Mom to offer breasts and look for signs of milk transfer.  2. Mom to supplement with EBM first followed by DBM (10-20 ml) per feeding. Mom aware to offer more volume if infant not latching at the breast.  3. Mom to pump with dEBP q 3 hrs for 15 min.   RN present as we reviewed feeding goals to increase more volume and note audible swallows during breastfeeding.  LPTI guidelines to reduce calorie loss reviewed infant birthweight less than 6 lbs. Mom aware to keep total feeding breast and supplementation under 30 min  All questions answered at the end of the visit.   Maternal Data Has patient been taught Hand Expression?: Yes  Feeding Mother's Current Feeding Choice: Breast Milk and Donor Milk  LATCH Score Latch: Repeated attempts needed to sustain latch, nipple held in mouth throughout feeding, stimulation needed to elicit sucking reflex.  Audible Swallowing: Spontaneous and intermittent  Type of Nipple: Everted at rest and after stimulation  Comfort (Breast/Nipple): Filling, red/small blisters or bruises, mild/mod discomfort  Hold (Positioning): Assistance needed to correctly position infant at breast and maintain latch.  LATCH Score: 7   Lactation Tools Discussed/Used Tools: Pump;Flanges Flange Size: 24 Breast pump type: Double-Electric Breast Pump (LC assessed flange size finding 24 comfortable fit for her. Mom start post pumping with DEBP to maintain her supply.) Pump Education:  Setup, frequency, and cleaning;Milk Storage Reason for Pumping: increase stimulation Pumping frequency: every 3 hrs for 15 min  Interventions Interventions: Breast feeding basics reviewed;Support pillows;Education;Assisted with latch;Position options;Skin to skin;Expressed milk;Breast massage;Hand express;DEBP;Breast compression;Adjust position;Infant Driven Feeding Algorithm education;Pace feeding  Discharge    Consult Status Consult Status: Follow-up Date: 11/25/20 Follow-up type: In-patient    Alysson Geist  [place] 11/24/2020, 11:37 PM

## 2020-11-24 NOTE — Progress Notes (Signed)
Post Partum Day 1 Subjective: no complaints, up ad lib, voiding, tolerating PO, + flatus, and lochia mild. She is bonding well with baby - breastfeeding at time of exam- great latch. She denies fever, chills, CP or HA. Baby did cluster feed last night so some fatigue today but otherwise well.   Objective: Blood pressure 103/84, pulse 100, temperature 98 F (36.7 C), temperature source Oral, resp. rate 16, height 5' 2.5" (1.588 m), weight 58.6 kg, last menstrual period 12/28/2019, SpO2 98 %, unknown if currently breastfeeding.  Physical Exam:  General: alert, cooperative, and no distress Lochia: appropriate Uterine Fundus: firm Incision: n/a DVT Evaluation: No evidence of DVT seen on physical exam. Negative Homan's sign.  Recent Labs    11/22/20 2315 11/24/20 0507  HGB 11.5* 9.5*  HCT 34.5* 29.1*    Assessment/Plan: Plan for discharge tomorrow and Breastfeeding    LOS: 2 days   Monique Duran 11/24/2020, 9:57 AM

## 2020-11-25 LAB — SURGICAL PATHOLOGY

## 2020-11-25 MED ORDER — ACETAMINOPHEN 325 MG PO TABS: 650.0000 mg | ORAL_TABLET | ORAL | 1 refills | Status: AC | PRN

## 2020-11-25 MED ORDER — IBUPROFEN 600 MG PO TABS: 600.0000 mg | ORAL_TABLET | Freq: Four times a day (QID) | ORAL | 0 refills | Status: AC

## 2020-11-25 NOTE — Progress Notes (Signed)
Post Partum Day 2 Subjective: no complaints, up ad lib, and voiding  Breastmilk starting to come in  Objective: Blood pressure 108/69, pulse 64, temperature 97.8 F (36.6 C), temperature source Oral, resp. rate 17, height 5' 2.5" (1.588 m), weight 58.6 kg, last menstrual period 12/28/2019, SpO2 100 %, unknown if currently breastfeeding.  Physical Exam:  General: alert and cooperative Lochia: appropriate Uterine Fundus: firm   Recent Labs    11/22/20 2315 11/24/20 0507  HGB 11.5* 9.5*  HCT 34.5* 29.1*    Assessment/Plan: Discharge home, may room in if baby has to stay for jaundice. F/u 6 weeks considering IUD   LOS: 3 days   Oliver Pila 11/25/2020, 9:26 AM

## 2020-11-25 NOTE — Discharge Summary (Signed)
Postpartum Discharge Summary      Patient Name: Monique Duran DOB: 12-08-2000 MRN: [id number]  Date of admission: 11/22/2020 Delivery date:11/23/2020  Delivering provider: Marlow Baars  Date of discharge: 11/25/2020  Admitting diagnosis: Indication for care in labor or delivery [O75.9] Intrauterine pregnancy: [redacted]w[redacted]d     Secondary diagnosis:  Active Problems:   Indication for care in labor or delivery  Additional problems: intrapartum fever, resolved quickly after deliverywith one dose of antibiotics    Discharge diagnosis: Term Pregnancy Delivered                                              Post partum procedures: antibiotics Augmentation: Pitocin Complications: None  Hospital course: Onset of Labor With Vaginal Delivery      20 y.o. yo G1P1001 at [redacted]w[redacted]d was admitted in Latent Labor on 11/22/2020. Patient had an uncomplicated labor course as follows:  Membrane Rupture Time/Date: 9:26 PM ,11/22/2020   Delivery Method:Vaginal, Spontaneous  Episiotomy: None  Lacerations:  Vaginal  Patient had an uncomplicated postpartum course.  She is ambulating, tolerating a regular diet, passing flatus, and urinating well. Patient is discharged home in stable condition on 11/25/20.  Newborn Data: Birth date:11/23/2020  Birth time:8:49 AM  Gender:Female  Living status:Living  Apgars:9 ,9  Weight:2546 g   Physical exam  Vitals:   11/23/20 1955 11/24/20 0557 11/24/20 2258 11/25/20 0519  BP: 107/68 103/84 105/69 108/69  Pulse: 80 100 85 64  Resp: 16 16 17 17   Temp: 98.1 F (36.7 C) 98 F (36.7 C) 98.1 F (36.7 C) 97.8 F (36.6 C)  TempSrc: Oral Oral  Oral  SpO2: 98% 98% 100% 100%  Weight:      Height:       General: alert and cooperative Lochia: appropriate Uterine Fundus: firm  Labs: Lab Results  Component Value Date   WBC 17.3 (H) 11/24/2020   HGB 9.5 (L) 11/24/2020   HCT 29.1 (L) 11/24/2020   MCV 89.3 11/24/2020   PLT 265 11/24/2020   No flowsheet data  found. Edinburgh Score: Edinburgh Postnatal Depression Scale Screening Tool 11/24/2020  I have been able to laugh and see the funny side of things. 0  I have looked forward with enjoyment to things. 0  I have blamed myself unnecessarily when things went wrong. 2  I have been anxious or worried for no good reason. 1  I have felt scared or panicky for no good reason. 0  Things have been getting on top of me. 1  I have been so unhappy that I have had difficulty sleeping. 0  I have felt sad or miserable. 1  I have been so unhappy that I have been crying. 1  The thought of harming myself has occurred to me. 0  Edinburgh Postnatal Depression Scale Total 6     After visit meds:  Allergies as of 11/25/2020   No Known Allergies      Medication List     TAKE these medications    acetaminophen 325 MG tablet Commonly known as: Tylenol Take 2 tablets (650 mg total) by mouth every 4 (four) hours as needed (for pain scale < 4).   ibuprofen 600 MG tablet Commonly known as: ADVIL Take 1 tablet (600 mg total) by mouth every 6 (six) hours.   PRENATAL GUMMIES PO Take 2 Doses by mouth daily.  Discharge home in stable condition Infant Feeding: Breast Infant Disposition:home with mother Discharge instruction: per After Visit Summary and Postpartum booklet. Activity: Advance as tolerated. Pelvic rest for 6 weeks.  Diet: routine diet Future Appointments:No future appointments. Follow up Visit:  Follow-up Information     Edwinna Areola, DO. Schedule an appointment as soon as possible for a visit in 6 week(s).   Specialty: Obstetrics and Gynecology Why: Postpartum and possible IUD placement Contact information: [address] [place] [address] [place] [address] [phone]                  Please schedule this patient for a In person postpartum visit in 6 weeks with the following provider: MD. A Delivery mode:  Vaginal, Spontaneous  Anticipated Birth Control:   IUD   11/25/2020 Monique Pila, MD

## 2020-12-06 ENCOUNTER — Telehealth (HOSPITAL_COMMUNITY): Payer: Self-pay | Admitting: *Deleted

## 2020-12-06 NOTE — Telephone Encounter (Signed)
Attempted Hospital Discharge Follow-Up Call.  Left voice mail requesting that patient return RN's phone call.  

## 2021-06-20 ENCOUNTER — Ambulatory Visit: Payer: Managed Care, Other (non HMO) | Admitting: Physician Assistant

## 2021-06-20 NOTE — Progress Notes (Deleted)
   New Patient Office Visit  Subjective:  Patient ID: Monique Duran, female    DOB: 2000/03/16  Age: 21 y.o. MRN: [id number]  CC: No chief complaint on file.   HPI [name] presents to establish care ***  Outpatient Encounter Medications as of 06/20/2021  Medication Sig   acetaminophen (TYLENOL) 325 MG tablet Take 2 tablets (650 mg total) by mouth every 4 (four) hours as needed (for pain scale < 4).   ibuprofen (ADVIL) 600 MG tablet Take 1 tablet (600 mg total) by mouth every 6 (six) hours.   Prenatal MV & Min w/FA-DHA (PRENATAL GUMMIES PO) Take 2 Doses by mouth daily.   No facility-administered encounter medications on file as of 06/20/2021.    Past Medical History:  Diagnosis Date   Headache     No past surgical history on file.  Family History  Problem Relation Age of Onset   Ovarian cancer Mother    Cancer Maternal Grandmother     Social History   Socioeconomic History   Marital status: Single    Spouse name: Not on file   Number of children: Not on file   Years of education: Not on file   Highest education level: Not on file  Occupational History   Not on file  Tobacco Use   Smoking status: Never   Smokeless tobacco: Never  Vaping Use   Vaping Use: Never used  Substance and Sexual Activity   Alcohol use: Never   Drug use: Never   Sexual activity: Yes  Other Topics Concern   Not on file  Social History Narrative   Not on file   Social Determinants of Health   Financial Resource Strain: Not on file  Food Insecurity: Not on file  Transportation Needs: Not on file  Physical Activity: Not on file  Stress: Not on file  Social Connections: Not on file  Intimate Partner Violence: Not on file    ROS Review of Systems  Objective:   Today's Vitals: There were no vitals taken for this visit.  Physical Exam  {Labs (Optional):23779}  Assessment & Plan:   Problem List Items Addressed This Visit   None   Follow-up: No follow-ups on  file.   Jake Shark, PA-C

## 2022-10-03 ENCOUNTER — Emergency Department (HOSPITAL_BASED_OUTPATIENT_CLINIC_OR_DEPARTMENT_OTHER)
Admission: EM | Admit: 2022-10-03 | Discharge: 2022-10-03 | Disposition: A | Discharge status: Home / Self Care | Payer: Managed Care, Other (non HMO) | Attending: Emergency Medicine | Admitting: Emergency Medicine

## 2022-10-03 ENCOUNTER — Emergency Department (HOSPITAL_BASED_OUTPATIENT_CLINIC_OR_DEPARTMENT_OTHER): Payer: Managed Care, Other (non HMO)

## 2022-10-03 ENCOUNTER — Other Ambulatory Visit: Payer: Self-pay

## 2022-10-03 ENCOUNTER — Encounter (HOSPITAL_BASED_OUTPATIENT_CLINIC_OR_DEPARTMENT_OTHER): Payer: Self-pay

## 2022-10-03 DIAGNOSIS — B3731 Acute candidiasis of vulva and vagina: Secondary | ICD-10-CM | POA: Insufficient documentation

## 2022-10-03 DIAGNOSIS — R0600 Dyspnea, unspecified: Secondary | ICD-10-CM | POA: Insufficient documentation

## 2022-10-03 DIAGNOSIS — Z20822 Contact with and (suspected) exposure to covid-19: Secondary | ICD-10-CM | POA: Insufficient documentation

## 2022-10-03 DIAGNOSIS — O23591 Infection of other part of genital tract in pregnancy, first trimester: Secondary | ICD-10-CM | POA: Diagnosis present

## 2022-10-03 DIAGNOSIS — Z3A13 13 weeks gestation of pregnancy: Secondary | ICD-10-CM | POA: Insufficient documentation

## 2022-10-03 LAB — URINALYSIS, ROUTINE W REFLEX MICROSCOPIC
Bilirubin Urine: NEGATIVE
Glucose, UA: NEGATIVE mg/dL
Hgb urine dipstick: NEGATIVE
Ketones, ur: NEGATIVE mg/dL
Nitrite: NEGATIVE
Protein, ur: NEGATIVE mg/dL
Specific Gravity, Urine: >=1.03 (ref 1.005–1.030)
pH: 6.5 (ref 5.0–8.0)

## 2022-10-03 LAB — CBC WITH DIFFERENTIAL/PLATELET
Abs Immature Granulocytes: 0.01 10*3/uL (ref 0.00–0.07)
Basophils Absolute: 0 10*3/uL (ref 0.0–0.1)
Basophils Relative: 0 %
Eosinophils Absolute: 0.1 10*3/uL (ref 0.0–0.5)
Eosinophils Relative: 1 %
HCT: 35.4 % — ABNORMAL LOW (ref 36.0–46.0)
Hemoglobin: 12.2 g/dL (ref 12.0–15.0)
Immature Granulocytes: 0 %
Lymphocytes Relative: 29 %
Lymphs Abs: 2.2 10*3/uL (ref 0.7–4.0)
MCH: 30.8 pg (ref 26.0–34.0)
MCHC: 34.5 g/dL (ref 30.0–36.0)
MCV: 89.4 fL (ref 80.0–100.0)
Monocytes Absolute: 0.6 10*3/uL (ref 0.1–1.0)
Monocytes Relative: 7 %
Neutro Abs: 4.6 10*3/uL (ref 1.7–7.7)
Neutrophils Relative %: 63 %
Platelets: 258 10*3/uL (ref 150–400)
RBC: 3.96 MIL/uL (ref 3.87–5.11)
RDW: 12.8 % (ref 11.5–15.5)
WBC: 7.4 10*3/uL (ref 4.0–10.5)
nRBC: 0 % (ref 0.0–0.2)

## 2022-10-03 LAB — WET PREP, GENITAL
Clue Cells Wet Prep HPF POC: NONE SEEN
Sperm: NONE SEEN
Trich, Wet Prep: NONE SEEN
WBC, Wet Prep HPF POC: <10 (ref <10)
Yeast Wet Prep HPF POC: NONE SEEN

## 2022-10-03 LAB — URINALYSIS, MICROSCOPIC (REFLEX)

## 2022-10-03 LAB — BASIC METABOLIC PANEL
Anion gap: 6 (ref 5–15)
BUN: 11 mg/dL (ref 6–20)
CO2: 23 mmol/L (ref 22–32)
Calcium: 8.5 mg/dL — ABNORMAL LOW (ref 8.9–10.3)
Chloride: 105 mmol/L (ref 98–111)
Creatinine, Ser: 0.63 mg/dL (ref 0.44–1.00)
GFR, Estimated: >60 mL/min (ref >60)
Glucose, Bld: 99 mg/dL (ref 70–99)
Potassium: 3.8 mmol/L (ref 3.5–5.1)
Sodium: 134 mmol/L — ABNORMAL LOW (ref 135–145)

## 2022-10-03 LAB — GC/CHLAMYDIA PROBE AMP (~~LOC~~) NOT AT ARMC
Chlamydia: NEGATIVE
Comment: NEGATIVE
Comment: NORMAL
Neisseria Gonorrhea: NEGATIVE

## 2022-10-03 LAB — SARS CORONAVIRUS 2 BY RT PCR: SARS Coronavirus 2 by RT PCR: NEGATIVE

## 2022-10-03 NOTE — ED Provider Notes (Signed)
[place] [facility]  Provider Note  CSN: [id number] Arrival date & time: 10/03/22 0254  History Chief Complaint  Patient presents with   Shortness of Breath    Monique Duran is a 22 y.o. female with no significant PMH is approx [redacted]wks pregnant by LMP. Has not had Ob visit or PNC yet. She is here for two complaints, first is some vaginal itching for the last few days, consistent with prior yeast infections. Denies vaginal discharge or STI exposure, but would like STI check.  She also reports two episodes during the day today where she felt hot, SOB and dizzy and had to sit down in front of a fan to cool off. Lasted about each time and felt fine afterwards. She denies any recent fever but has had some dry cough. No CP, no leg swelling. Feeling well at the time of my evaluation.    Home Medications Prior to Admission medications   Medication Sig Start Date End Date Taking? Authorizing Provider  acetaminophen (TYLENOL) 325 MG tablet Take 2 tablets (650 mg total) by mouth every 4 (four) hours as needed (for pain scale < 4). 11/25/20   Huel Cote, MD  ibuprofen (ADVIL) 600 MG tablet Take 1 tablet (600 mg total) by mouth every 6 (six) hours. 11/25/20   Huel Cote, MD  Prenatal MV & Min w/FA-DHA (PRENATAL GUMMIES PO) Take 2 Doses by mouth daily.    [provider]     Allergies    Patient has no known allergies.   Review of Systems   Review of Systems Please see HPI for pertinent positives and negatives  Physical Exam BP (!) 129/96 (BP Location: Right Arm)   Pulse 75   Temp 97.8 F (36.6 C) (Oral)   Resp 15   Ht 5\' 2"  (1.575 m)   Wt 51.3 kg   SpO2 100%   BMI 20.67 kg/m   Physical Exam Vitals and nursing note reviewed.  Constitutional:      Appearance: Normal appearance.  HENT:     Head: Normocephalic and atraumatic.     Nose: Nose normal.     Mouth/Throat:     Mouth: Mucous membranes are moist.   Eyes:     Extraocular Movements: Extraocular movements intact.     Conjunctiva/sclera: Conjunctivae normal.  Cardiovascular:     Rate and Rhythm: Normal rate.  Pulmonary:     Effort: Pulmonary effort is normal.     Breath sounds: Normal breath sounds.  Abdominal:     General: Abdomen is flat.     Palpations: Abdomen is soft.     Tenderness: There is no abdominal tenderness.  Musculoskeletal:        General: No swelling. Normal range of motion.     Cervical back: Neck supple.  Skin:    General: Skin is warm and dry.  Neurological:     General: No focal deficit present.     Mental Status: She is alert.  Psychiatric:        Mood and Affect: Mood normal.     ED Results / Procedures / Treatments   EKG EKG Interpretation Date/Time:  Wednesday October 03 2022 03:26:36 EDT Ventricular Rate:  86 PR Interval:  152 QRS Duration:  66 QT Interval:  347 QTC Calculation: 415 R Axis:   66  Text Interpretation: Sinus rhythm Low voltage, precordial leads No old tracing to compare Confirmed by Susy Frizzle [phone]) on 10/03/2022 3:27:48 AM  Procedures Procedures  Medications Ordered in the ED Medications - No data to display  Initial Impression and Plan  Patient here for two complaints. She will self-swab for her vaginal itching. For her SOB spells, will check labs, CXR, EKG and Covid swab. Her vitals and exam are reassuring. No concern for ACS or PE. She was encouraged to establish with Ob for her [facility], but no pregnancy related to complaints tonight.   ED Course   Clinical Course as of 10/03/22 0429  Wed Oct 03, 2022  0343 CBC is unremarkable.  [CS]  0357 Wet prep is negative, no signs of vaginitis, however patient's symptoms consistent with vulvar candidiasis, Advised to use OTC vaginal suppository/cream.  [CS]  0401 I personally viewed the images from radiology studies and agree with radiologist interpretation: CXR is clear [CS]  0408 UA with some bacteria, no other convincing  signs of infection. Will send for culture.  [CS]  0419 BMP is normal. Covid is negative. Patient remains well appearing in no distress. Unclear etiology of her SOB/dizzy spells, recommend she rest, stay hydrated and follow up with Ob.  [CS]    Clinical Course User Index [CS] Pollyann Savoy, MD     MDM Rules/Calculators/A&P Medical Decision Making Problems Addressed: Dyspnea, unspecified type: acute illness or injury Vulvar candidiasis: acute illness or injury  Amount and/or Complexity of Data Reviewed Labs: ordered. Decision-making details documented in ED Course. Radiology: ordered and independent interpretation performed. Decision-making details documented in ED Course. ECG/medicine tests: ordered and independent interpretation performed. Decision-making details documented in ED Course.  Risk OTC drugs.     Final Clinical Impression(s) / ED Diagnoses Final diagnoses:  Dyspnea, unspecified type  [redacted] weeks gestation of pregnancy  Vulvar candidiasis    Rx / DC Orders ED Discharge Orders     None        Pollyann Savoy, MD 10/03/22 [phone]

## 2022-10-03 NOTE — ED Notes (Signed)
ED Provider at bedside. 

## 2022-10-03 NOTE — ED Notes (Signed)
Patient transported to X-ray 

## 2022-10-03 NOTE — ED Triage Notes (Signed)
Pt reports SOB all day today. Pt reports vaginal itching with hx of frequent yeast infections. Also states she felt "very hot". Pt denies CP, fatigue. Pt reports nausea and states she is [redacted] weeks pregnant at this time and is nauseous at baseline. No acute respiratory distress noted.

## 2022-10-04 LAB — URINE CULTURE: Culture: NO GROWTH

## 2022-10-24 ENCOUNTER — Other Ambulatory Visit: Payer: Self-pay

## 2022-10-24 ENCOUNTER — Encounter (HOSPITAL_BASED_OUTPATIENT_CLINIC_OR_DEPARTMENT_OTHER): Payer: Self-pay

## 2022-10-24 ENCOUNTER — Emergency Department (HOSPITAL_BASED_OUTPATIENT_CLINIC_OR_DEPARTMENT_OTHER): Payer: Managed Care, Other (non HMO)

## 2022-10-24 DIAGNOSIS — Z3A16 16 weeks gestation of pregnancy: Secondary | ICD-10-CM | POA: Insufficient documentation

## 2022-10-24 DIAGNOSIS — O26892 Other specified pregnancy related conditions, second trimester: Secondary | ICD-10-CM | POA: Diagnosis not present

## 2022-10-24 DIAGNOSIS — O4692 Antepartum hemorrhage, unspecified, second trimester: Secondary | ICD-10-CM | POA: Insufficient documentation

## 2022-10-24 NOTE — ED Triage Notes (Signed)
Pt states she is [redacted] weeks pregnant Spotting x 3 days and cramping

## 2022-10-25 ENCOUNTER — Emergency Department (HOSPITAL_BASED_OUTPATIENT_CLINIC_OR_DEPARTMENT_OTHER)
Admission: EM | Admit: 2022-10-25 | Discharge: 2022-10-25 | Disposition: A | Discharge status: Home / Self Care | Payer: Managed Care, Other (non HMO) | Attending: Emergency Medicine | Admitting: Emergency Medicine

## 2022-10-25 DIAGNOSIS — O4690 Antepartum hemorrhage, unspecified, unspecified trimester: Secondary | ICD-10-CM

## 2022-10-25 LAB — BASIC METABOLIC PANEL
Anion gap: 9 (ref 5–15)
BUN: 7 mg/dL (ref 6–20)
CO2: 22 mmol/L (ref 22–32)
Calcium: 8.3 mg/dL — ABNORMAL LOW (ref 8.9–10.3)
Chloride: 103 mmol/L (ref 98–111)
Creatinine, Ser: 0.59 mg/dL (ref 0.44–1.00)
GFR, Estimated: >60 mL/min (ref >60)
Glucose, Bld: 80 mg/dL (ref 70–99)
Potassium: 3.7 mmol/L (ref 3.5–5.1)
Sodium: 134 mmol/L — ABNORMAL LOW (ref 135–145)

## 2022-10-25 LAB — CBC
HCT: 34.7 % — ABNORMAL LOW (ref 36.0–46.0)
Hemoglobin: 11.8 g/dL — ABNORMAL LOW (ref 12.0–15.0)
MCH: 30.7 pg (ref 26.0–34.0)
MCHC: 34 g/dL (ref 30.0–36.0)
MCV: 90.4 fL (ref 80.0–100.0)
Platelets: 323 10*3/uL (ref 150–400)
RBC: 3.84 MIL/uL — ABNORMAL LOW (ref 3.87–5.11)
RDW: 12.8 % (ref 11.5–15.5)
WBC: 8.7 10*3/uL (ref 4.0–10.5)
nRBC: 0 % (ref 0.0–0.2)

## 2022-10-25 LAB — ABO/RH: ABO/RH(D): A POS

## 2022-10-25 LAB — HCG, QUANTITATIVE, PREGNANCY: hCG, Beta Chain, Quant, S: 39440 m[IU]/mL — ABNORMAL HIGH (ref <5)

## 2022-10-25 NOTE — Discharge Instructions (Signed)
You were evaluated in the Emergency Department and after careful evaluation, we did not find any emergent condition requiring admission or further testing in the hospital.  Your exam/testing today is overall reassuring.  Recommend close follow-up with your OB/GYN for continued evaluation of your spotting during pregnancy.  Your ultrasound today was reassuring.  Please return to the Emergency Department if you experience any worsening of your condition.   Thank you for allowing [place] to be a part of your care.

## 2022-10-25 NOTE — ED Provider Notes (Signed)
[facility] Emergency Department Provider Note MRN:  [id number]  Arrival date & time: 10/25/22     Chief Complaint   Vaginal Bleeding   History of Present Illness   Monique Duran is a 22 y.o. year-old female with no pertinent past medical history presenting to the ED with chief complaint of vaginal bleeding.  Vaginal spotting today as well as lower abdominal cramping on the left side.  [redacted] weeks pregnant.  No fever, no vaginal discharge, no other complaints.  Review of Systems  A thorough review of systems was obtained and all systems are negative except as noted in the HPI and PMH.   Patient's Health History    Past Medical History:  Diagnosis Date   Headache     History reviewed. No pertinent surgical history.  Family History  Problem Relation Age of Onset   Ovarian cancer Mother    Cancer Maternal Grandmother     Social History   Socioeconomic History   Marital status: Single    Spouse name: Not on file   Number of children: Not on file   Years of education: Not on file   Highest education level: Not on file  Occupational History   Not on file  Tobacco Use   Smoking status: Never   Smokeless tobacco: Never  Vaping Use   Vaping status: Never Used  Substance and Sexual Activity   Alcohol use: Never   Drug use: Never   Sexual activity: Yes  Other Topics Concern   Not on file  Social History Narrative   Not on file   Social Determinants of Health   Financial Resource Strain: Not on file  Food Insecurity: Not on file  Transportation Needs: Not on file  Physical Activity: Not on file  Stress: Not on file  Social Connections: Not on file  Intimate Partner Violence: Not on file     Physical Exam   Vitals:   10/24/22 2159  BP: (!) 129/91  Pulse: 75  Resp: 18  Temp: 98 F (36.7 C)  SpO2: 100%    CONSTITUTIONAL: Well-appearing, NAD NEURO/PSYCH:  Alert and oriented x 3, no focal deficits EYES:  eyes equal and  reactive ENT/NECK:  no LAD, no JVD CARDIO: Regular rate, well-perfused, normal S1 and S2 PULM:  CTAB no wheezing or rhonchi GI/GU:  non-distended, mild left lower quadrant tenderness to palpation MSK/SPINE:  No gross deformities, no edema SKIN:  no rash, atraumatic   *Additional and/or pertinent findings included in MDM below  Diagnostic and Interventional Summary    EKG Interpretation Date/Time:    Ventricular Rate:    PR Interval:    QRS Duration:    QT Interval:    QTC Calculation:   R Axis:      Text Interpretation:         Labs Reviewed  CBC - Abnormal; Notable for the following components:      Result Value   RBC 3.84 (*)    Hemoglobin 11.8 (*)    HCT 34.7 (*)    All other components within normal limits  BASIC METABOLIC PANEL - Abnormal; Notable for the following components:   Sodium 134 (*)    Calcium 8.3 (*)    All other components within normal limits  HCG, QUANTITATIVE, PREGNANCY  ABO/RH    US OB Limited > 14 wks  Final Result      Medications - No data to display   Procedures  /  Critical Care  Procedures  ED Course and Medical Decision Making  Initial Impression and Ddx Differential diagnosis includes ectopic pregnancy, miscarriage, vaginal bleeding in early pregnancy, ovarian cyst  Past medical/surgical history that increases complexity of ED encounter: None  Interpretation of Diagnostics I personally reviewed the laboratory assessment and my interpretation is as follows: No significant blood count or electrolyte disturbance  Ultrasound reassuring, live IUP, no abnormalities.  Patient Reassessment and Ultimate Disposition/Management     Given patient's well-appearing nature, normal vital signs, generally reassuring abdominal exam, she is appropriate for OB/GYN follow-up.  Her hCG quant is pending, obtained mostly to benefit any OB/GYN follow-up.  She reports that she is blood type A positive and so no need for RhoGAM.  Patient management  required discussion with the following services or consulting groups:  None  Complexity of Problems Addressed Acute illness or injury that poses threat of life of bodily function  Additional Data Reviewed and Analyzed Further history obtained from: Prior labs/imaging results  Additional Factors Impacting ED Encounter Risk None  Elmer Sow. Pilar Plate, MD [facility] Emergency Medicine [facility] [email or web address]  Final Clinical Impressions(s) / ED Diagnoses     ICD-10-CM   1. Vaginal bleeding in pregnancy  O46.90       ED Discharge Orders     None        Discharge Instructions Discussed with and Provided to Patient:    Discharge Instructions      You were evaluated in the Emergency Department and after careful evaluation, we did not find any emergent condition requiring admission or further testing in the hospital.  Your exam/testing today is overall reassuring.  Recommend close follow-up with your OB/GYN for continued evaluation of your spotting during pregnancy.  Your ultrasound today was reassuring.  Please return to the Emergency Department if you experience any worsening of your condition.   Thank you for allowing [place] to be a part of your care.      Sabas Sous, MD 10/25/22 [phone]

## 2023-02-03 ENCOUNTER — Inpatient Hospital Stay (HOSPITAL_COMMUNITY): Payer: Managed Care, Other (non HMO)

## 2023-02-03 ENCOUNTER — Other Ambulatory Visit: Payer: Self-pay

## 2023-02-03 ENCOUNTER — Inpatient Hospital Stay (HOSPITAL_BASED_OUTPATIENT_CLINIC_OR_DEPARTMENT_OTHER)
Admission: EM | Admit: 2023-02-03 | Discharge: 2023-02-03 | Disposition: A | Discharge status: Home / Self Care | Payer: Managed Care, Other (non HMO) | Attending: Obstetrics & Gynecology | Admitting: Obstetrics & Gynecology

## 2023-02-03 ENCOUNTER — Encounter (HOSPITAL_BASED_OUTPATIENT_CLINIC_OR_DEPARTMENT_OTHER): Payer: Self-pay | Admitting: Emergency Medicine

## 2023-02-03 DIAGNOSIS — O288 Other abnormal findings on antenatal screening of mother: Secondary | ICD-10-CM

## 2023-02-03 DIAGNOSIS — Z3689 Encounter for other specified antenatal screening: Secondary | ICD-10-CM | POA: Diagnosis not present

## 2023-02-03 DIAGNOSIS — N898 Other specified noninflammatory disorders of vagina: Secondary | ICD-10-CM

## 2023-02-03 DIAGNOSIS — Z3A31 31 weeks gestation of pregnancy: Secondary | ICD-10-CM

## 2023-02-03 DIAGNOSIS — R102 Pelvic and perineal pain: Secondary | ICD-10-CM | POA: Insufficient documentation

## 2023-02-03 DIAGNOSIS — R109 Unspecified abdominal pain: Secondary | ICD-10-CM | POA: Diagnosis not present

## 2023-02-03 DIAGNOSIS — O4693 Antepartum hemorrhage, unspecified, third trimester: Secondary | ICD-10-CM

## 2023-02-03 DIAGNOSIS — O26893 Other specified pregnancy related conditions, third trimester: Secondary | ICD-10-CM | POA: Insufficient documentation

## 2023-02-03 DIAGNOSIS — O26853 Spotting complicating pregnancy, third trimester: Secondary | ICD-10-CM | POA: Insufficient documentation

## 2023-02-03 DIAGNOSIS — B379 Candidiasis, unspecified: Secondary | ICD-10-CM | POA: Insufficient documentation

## 2023-02-03 DIAGNOSIS — O98813 Other maternal infectious and parasitic diseases complicating pregnancy, third trimester: Secondary | ICD-10-CM | POA: Diagnosis not present

## 2023-02-03 LAB — URINALYSIS, ROUTINE W REFLEX MICROSCOPIC
Bilirubin Urine: NEGATIVE
Glucose, UA: NEGATIVE mg/dL
Hgb urine dipstick: NEGATIVE
Ketones, ur: NEGATIVE mg/dL
Nitrite: NEGATIVE
Specific Gravity, Urine: >1.03 — ABNORMAL HIGH (ref 1.005–1.030)
pH: 6.5 (ref 5.0–8.0)

## 2023-02-03 LAB — CBC WITH DIFFERENTIAL/PLATELET
Abs Immature Granulocytes: 0.04 10*3/uL (ref 0.00–0.07)
Basophils Absolute: 0 10*3/uL (ref 0.0–0.1)
Basophils Relative: 0 %
Eosinophils Absolute: 0 10*3/uL (ref 0.0–0.5)
Eosinophils Relative: 0 %
HCT: 30.3 % — ABNORMAL LOW (ref 36.0–46.0)
Hemoglobin: 10.2 g/dL — ABNORMAL LOW (ref 12.0–15.0)
Immature Granulocytes: 0 %
Lymphocytes Relative: 21 %
Lymphs Abs: 2 10*3/uL (ref 0.7–4.0)
MCH: 29.8 pg (ref 26.0–34.0)
MCHC: 33.7 g/dL (ref 30.0–36.0)
MCV: 88.6 fL (ref 80.0–100.0)
Monocytes Absolute: 0.7 10*3/uL (ref 0.1–1.0)
Monocytes Relative: 8 %
Neutro Abs: 6.4 10*3/uL (ref 1.7–7.7)
Neutrophils Relative %: 71 %
Platelets: 262 10*3/uL (ref 150–400)
RBC: 3.42 MIL/uL — ABNORMAL LOW (ref 3.87–5.11)
RDW: 13 % (ref 11.5–15.5)
WBC: 9.1 10*3/uL (ref 4.0–10.5)
nRBC: 0 % (ref 0.0–0.2)

## 2023-02-03 LAB — COMPREHENSIVE METABOLIC PANEL
ALT: 11 U/L (ref 0–44)
AST: 18 U/L (ref 15–41)
Albumin: 2.9 g/dL — ABNORMAL LOW (ref 3.5–5.0)
Alkaline Phosphatase: 74 U/L (ref 38–126)
Anion gap: 8 (ref 5–15)
BUN: 9 mg/dL (ref 6–20)
CO2: 22 mmol/L (ref 22–32)
Calcium: 8.8 mg/dL — ABNORMAL LOW (ref 8.9–10.3)
Chloride: 104 mmol/L (ref 98–111)
Creatinine, Ser: 0.58 mg/dL (ref 0.44–1.00)
GFR, Estimated: >60 mL/min (ref >60)
Glucose, Bld: 81 mg/dL (ref 70–99)
Potassium: 3.9 mmol/L (ref 3.5–5.1)
Sodium: 134 mmol/L — ABNORMAL LOW (ref 135–145)
Total Bilirubin: 0.5 mg/dL (ref <1.2)
Total Protein: 6.6 g/dL (ref 6.5–8.1)

## 2023-02-03 LAB — URINALYSIS, MICROSCOPIC (REFLEX)

## 2023-02-03 LAB — WET PREP, GENITAL
Clue Cells Wet Prep HPF POC: NONE SEEN
Sperm: NONE SEEN
Trich, Wet Prep: NONE SEEN
WBC, Wet Prep HPF POC: >=10 — AB (ref <10)
Yeast Wet Prep HPF POC: NONE SEEN

## 2023-02-03 MED ORDER — ACETAMINOPHEN 325 MG PO TABS
650.0000 mg | ORAL_TABLET | Freq: Once | ORAL | Status: AC
  Administered 2023-02-03: 650 mg via ORAL
  Filled 2023-02-03: qty 2

## 2023-02-03 MED ORDER — TERCONAZOLE 0.4 % VA CREA
1.0000 | TOPICAL_CREAM | Freq: Every day | VAGINAL | 0 refills | Status: AC
Start: 2023-02-03 — End: ?

## 2023-02-03 NOTE — ED Notes (Signed)
[facility] called for transport. 

## 2023-02-03 NOTE — ED Notes (Signed)
Dr. Debroah Loop wants pt transported to MAU as there was a decel noted in babys heart rate. States to keep on monitor until picked up and to get her to MAU as soon as possible

## 2023-02-03 NOTE — ED Notes (Signed)
Pt now reporting some pelvic cramping. Received tylenol not long ago.

## 2023-02-03 NOTE — ED Triage Notes (Signed)
Pt reports pelvic cramping and some spotting since yesterday; 31wk 0 days

## 2023-02-03 NOTE — Progress Notes (Signed)
Dr. Debroah Loop notified of the variable decel on the FHR tracing. Pt is to be transferred here to MAU for further observation. 1859. Spoke with Larene Pickett. Pt is to be transferred to MAU.

## 2023-02-03 NOTE — ED Notes (Signed)
Spoke with [facility], no ETA on [facility] yet. Updated Beverly on pt status--pt feels comfortable, in no pain, NAD. Will continue to monitor.

## 2023-02-03 NOTE — MAU Note (Signed)
.  Monique Duran is a 22 y.o. at [redacted]w[redacted]d transfer from ER [place] with complaints of cramping and spotting. Noted to have decelerations on monitoring.   Onset of complaint: 2 days Pain score: 6.5/10 Vitals:   02/03/23 2000 02/03/23 2030  BP: 102/69 115/68  Pulse: 87 (!) 101  Resp:  16  Temp:    SpO2: 99% 99%     FHT:140

## 2023-02-03 NOTE — Progress Notes (Signed)
MAU notified that the p.t is to be transferred to their unit. Report given to [facility].

## 2023-02-03 NOTE — ED Provider Notes (Signed)
[place] [facility] Provider Note   CSN: [id number] Arrival date & time: 02/03/23  1757     History  Chief Complaint  Patient presents with   Pelvic Cramping    CARLIANA Duran is a 22 y.o. female.  HPI 22 year old female who is G4P1 presents with abdominal cramping. Has had mild abdominal cramping since yesterday. It's mostly suprapubic and left sided. No dysuria. No fevers, vomiting or new back pain. Pain comes and goes, lasting about 15-20 seconds at a time. Sometimes it's more severe than others. No discharge or leakage of fluid, but has noticed some mild blood when she wipes.   Home Medications Prior to Admission medications   Medication Sig Start Date End Date Taking? Authorizing Provider  acetaminophen (TYLENOL) 325 MG tablet Take 2 tablets (650 mg total) by mouth every 4 (four) hours as needed (for pain scale < 4). 11/25/20   Huel Cote, MD  ibuprofen (ADVIL) 600 MG tablet Take 1 tablet (600 mg total) by mouth every 6 (six) hours. 11/25/20   Huel Cote, MD  Prenatal MV & Min w/FA-DHA (PRENATAL GUMMIES PO) Take 2 Doses by mouth daily.    [provider]      Allergies    Patient has no known allergies.    Review of Systems   Review of Systems  Constitutional:  Negative for fever.  Gastrointestinal:  Positive for abdominal pain. Negative for vomiting.  Genitourinary:  Negative for dysuria.    Physical Exam Updated Vital Signs BP (!) 151/98 (BP Location: Left Arm)   Pulse 91   Temp 98.4 F (36.9 C) (Oral)   Resp 16   Ht 5\' 3"  (1.6 m)   Wt 58.5 kg   LMP 07/01/2022 (Exact Date)   SpO2 100%   BMI 22.85 kg/m  Physical Exam Vitals and nursing note reviewed.  Constitutional:      General: She is not in acute distress.    Appearance: She is well-developed. She is not ill-appearing or diaphoretic.     Comments: Sitting up, resting comfortably  HENT:     Head: Normocephalic and atraumatic.   Pulmonary:     Effort: Pulmonary effort is normal.  Abdominal:     Palpations: Abdomen is soft.     Tenderness: There is abdominal tenderness (mild) in the suprapubic area and left lower quadrant.     Comments: gravid  Skin:    General: Skin is warm and dry.  Neurological:     Mental Status: She is alert.     ED Results / Procedures / Treatments   Labs (all labs ordered are listed, but only abnormal results are displayed) Labs Reviewed  URINALYSIS, ROUTINE W REFLEX MICROSCOPIC - Abnormal; Notable for the following components:      Result Value   Specific Gravity, Urine >1.030 (*)    Protein, ur TRACE (*)    Leukocytes,Ua SMALL (*)    All other components within normal limits  COMPREHENSIVE METABOLIC PANEL - Abnormal; Notable for the following components:   Sodium 134 (*)    Calcium 8.8 (*)    Albumin 2.9 (*)    All other components within normal limits  CBC WITH DIFFERENTIAL/PLATELET - Abnormal; Notable for the following components:   RBC 3.42 (*)    Hemoglobin 10.2 (*)    HCT 30.3 (*)    All other components within normal limits  URINALYSIS, MICROSCOPIC (REFLEX) - Abnormal; Notable for the following components:   Bacteria,  UA MANY (*)    All other components within normal limits  URINE CULTURE    EKG None  Radiology No results found.  Procedures Procedures    Medications Ordered in ED Medications  acetaminophen (TYLENOL) tablet 650 mg (650 mg Oral Given 02/03/23 1855)    ED Course/ Medical Decision Making/ A&P                                 Medical Decision Making Amount and/or Complexity of Data Reviewed Labs: ordered.    Details: Contaminated appearing urine with squamous epithelial cells.  Anemia.  Risk OTC drugs. Decision regarding hospitalization.   Patient has a benign abdominal exam.  Her cramping does not sound like contractions.  She did have a deceleration noted on the tocometry and so OB/GYN is recommending transfer to MAU for  monitoring.  She is feeling a little better with some Tylenol.  Discussed with Dr. Debroah Loop who accepts in transfer to the MAU.  She has IV access.  Appears stable for transfer.        Final Clinical Impression(s) / ED Diagnoses Final diagnoses:  Abdominal pain during pregnancy in third trimester    Rx / DC Orders ED Discharge Orders     None         Pricilla Loveless, MD 02/03/23 1949

## 2023-02-03 NOTE — ED Notes (Signed)
Called OB Rapid response nurse, Corrie Dandy. Patient placed on Toco monitor. Visible fetal movement when placing on monitor. FHR 138

## 2023-02-03 NOTE — Progress Notes (Signed)
Received a call from [facility]. Pt is a G2P1 at [redacted] weeks gestation presenting with c/o lower abd cramping and a light pink discharge for the past 2 days. No leaking of fluid. Pt gets her care at [facility] and plans on delivering her baby at [facility] Pt has gestational diabetes and has been referred to a specialist. She is not on any meds for diabetes at this time.

## 2023-02-03 NOTE — MAU Provider Note (Signed)
History     CSN: [id number]  Arrival date and time: 02/03/23 1757   Event Date/Time   First Provider Initiated Contact with Patient 02/03/23 2222      Chief Complaint  Patient presents with   Pelvic Cramping   Monique Duran , a  22 y.o. [id number] at [redacted]w[redacted]d presents to MAU after being transferred from [facility] for prolonged monitoring. Patient states that she went to the ED because she has been having intermittent "bright pink vaginal discharge" only with wiping and on and off today she has been having some intermittent abdominal cramping. She currently rates pain a 0/10 but states prior to transport she was rating it a 6/10. She states that she was given tylenol at the previous hospital and it has helped. She denies other abnormal vaginal discharge with an odor or color and urinary symptoms. She endorses positive fetal movement.      Report from [facility] states that patient had some variable decels and was sent over for prolonged monitoring.     OB History     Gravida  4   Para  1   Term  1   Preterm      AB  2   Living  1      SAB      IAB  2   Ectopic      Multiple  0   Live Births  1           Past Medical History:  Diagnosis Date   Headache     History reviewed. No pertinent surgical history.  Family History  Problem Relation Age of Onset   Ovarian cancer Mother    Cancer Maternal Grandmother     Social History   Tobacco Use   Smoking status: Never   Smokeless tobacco: Never  Vaping Use   Vaping status: Never Used  Substance Use Topics   Alcohol use: Never   Drug use: Never    Allergies: No Known Allergies  Medications Prior to Admission  Medication Sig Dispense Refill Last Dose/Taking   Prenatal MV & Min w/FA-DHA (PRENATAL GUMMIES PO) Take 2 Doses by mouth daily.   02/03/2023   acetaminophen (TYLENOL) 325 MG tablet Take 2 tablets (650 mg total) by mouth every 4 (four) hours as needed (for pain scale < 4). 30 tablet 1     ibuprofen (ADVIL) 600 MG tablet Take 1 tablet (600 mg total) by mouth every 6 (six) hours. 30 tablet 0     Review of Systems  Constitutional:  Negative for chills, fatigue and fever.  Eyes:  Negative for pain and visual disturbance.  Respiratory:  Negative for apnea, shortness of breath and wheezing.   Cardiovascular:  Negative for chest pain and palpitations.  Gastrointestinal:  Positive for abdominal pain. Negative for constipation, diarrhea, nausea and vomiting.  Genitourinary:  Positive for pelvic pain, vaginal bleeding and vaginal discharge. Negative for difficulty urinating, dysuria and vaginal pain.  Musculoskeletal:  Negative for back pain.  Neurological:  Negative for seizures, weakness and headaches.  Psychiatric/Behavioral:  Negative for suicidal ideas.    Physical Exam   Blood pressure 119/75, pulse 79, temperature 98.4 F (36.9 C), temperature source Oral, resp. rate 16, height 5\' 3"  (1.6 m), weight 58.5 kg, last menstrual period 07/01/2022, SpO2 99%, unknown if currently breastfeeding.  Physical Exam Vitals and nursing note reviewed. Exam conducted with a chaperone present.  Constitutional:      General: She is not in acute  distress.    Appearance: Normal appearance.  HENT:     Head: Normocephalic.  Pulmonary:     Effort: Pulmonary effort is normal.  Abdominal:     Palpations: Abdomen is soft.     Tenderness: There is no abdominal tenderness.     Comments: Gravid uterus   Genitourinary:    Vagina: Vaginal discharge present.     Comments: White chunky vaginal discharge adhered to vaginal walls and between labia folds consistent with yeast.   Dilation: 1 Effacement (%): Thick Station: Ballotable Exam by:: Rhunette Croft, CNM  Musculoskeletal:     Cervical back: Normal range of motion.  Skin:    General: Skin is warm and dry.  Neurological:     Mental Status: She is alert and oriented to person, place, and time.  Psychiatric:        Mood and Affect: Mood  normal.    FHT: 145bpm with moderate variability accels present no decels upon arrival to MAU (appropriate for gestational age)  Toco: irregular contractions and patient unaware of them.   MAU Course  Procedures Orders Placed This Encounter  Procedures   Urine Culture   Wet prep, genital   Urinalysis, Routine w reflex microscopic -Urine, Clean Catch   Comprehensive metabolic panel   CBC with Differential   Urinalysis, Microscopic (reflex)   Diet NPO time specified   Continuous tocometry   Results for orders placed or performed during the hospital encounter of 02/03/23 (from the past 24 hours)  Urinalysis, Routine w reflex microscopic -Urine, Clean Catch     Status: Abnormal   Collection Time: 02/03/23  7:00 PM  Result Value Ref Range   Color, Urine YELLOW YELLOW   APPearance CLEAR CLEAR   Specific Gravity, Urine >1.030 (H) 1.005 - 1.030   pH 6.5 5.0 - 8.0   Glucose, UA NEGATIVE NEGATIVE mg/dL   Hgb urine dipstick NEGATIVE NEGATIVE   Bilirubin Urine NEGATIVE NEGATIVE   Ketones, ur NEGATIVE NEGATIVE mg/dL   Protein, ur TRACE (A) NEGATIVE mg/dL   Nitrite NEGATIVE NEGATIVE   Leukocytes,Ua SMALL (A) NEGATIVE  Comprehensive metabolic panel     Status: Abnormal   Collection Time: 02/03/23  7:00 PM  Result Value Ref Range   Sodium 134 (L) 135 - 145 mmol/L   Potassium 3.9 3.5 - 5.1 mmol/L   Chloride 104 98 - 111 mmol/L   CO2 22 22 - 32 mmol/L   Glucose, Bld 81 70 - 99 mg/dL   BUN 9 6 - 20 mg/dL   Creatinine, Ser 1.61 0.44 - 1.00 mg/dL   Calcium 8.8 (L) 8.9 - 10.3 mg/dL   Total Protein 6.6 6.5 - 8.1 g/dL   Albumin 2.9 (L) 3.5 - 5.0 g/dL   AST 18 15 - 41 U/L   ALT 11 0 - 44 U/L   Alkaline Phosphatase 74 38 - 126 U/L   Total Bilirubin 0.5 <1.2 mg/dL   GFR, Estimated >09 >60 mL/min   Anion gap 8 5 - 15  CBC with Differential     Status: Abnormal   Collection Time: 02/03/23  7:00 PM  Result Value Ref Range   WBC 9.1 4.0 - 10.5 K/uL   RBC 3.42 (L) 3.87 - 5.11 MIL/uL    Hemoglobin 10.2 (L) 12.0 - 15.0 g/dL   HCT 45.4 (L) 09.8 - 11.9 %   MCV 88.6 80.0 - 100.0 fL   MCH 29.8 26.0 - 34.0 pg   MCHC 33.7 30.0 - 36.0 g/dL  RDW 13.0 11.5 - 15.5 %   Platelets 262 150 - 400 K/uL   nRBC 0.0 0.0 - 0.2 %   Neutrophils Relative % 71 %   Neutro Abs 6.4 1.7 - 7.7 K/uL   Lymphocytes Relative 21 %   Lymphs Abs 2.0 0.7 - 4.0 K/uL   Monocytes Relative 8 %   Monocytes Absolute 0.7 0.1 - 1.0 K/uL   Eosinophils Relative 0 %   Eosinophils Absolute 0.0 0.0 - 0.5 K/uL   Basophils Relative 0 %   Basophils Absolute 0.0 0.0 - 0.1 K/uL   Immature Granulocytes 0 %   Abs Immature Granulocytes 0.04 0.00 - 0.07 K/uL  Urinalysis, Microscopic (reflex)     Status: Abnormal   Collection Time: 02/03/23  7:00 PM  Result Value Ref Range   RBC / HPF 0-5 0 - 5 RBC/hpf   WBC, UA 0-5 0 - 5 WBC/hpf   Bacteria, UA MANY (A) NONE SEEN   Squamous Epithelial / HPF 6-10 0 - 5 /HPF    MDM - BPP ordered  - NST reactive and Reassuring in MAU  - Given cervical exam, low suspicion for preterm labor.  - SG elevated and positive for Trace protein and Leuks but otherwise normal.  - Wet prep normal, but concern for yeast based on presentation and Spec exam. Plan to treat.  - BPP 8/8  - plan for discharge.   Assessment and Plan   1. Abdominal pain during pregnancy in third trimester   2. Yeast infection   3. [redacted] weeks gestation of pregnancy   4. Vaginal discharge   5. NST (non-stress test) reactive    - Reviewed that vaginal infections like yeast can cause spotting and abdominal discomfort in pregnancy. - Rx for terazole sent to outpatient pharmacy.  - Reviewed worsening signs and return precautions  - FHT appropriate for gestational age upon discharge.  - Patient discharged home in stable condition and may return to MAU as needed.   Claudette Head, MSN CNM  02/03/2023, 10:22 PM

## 2023-02-03 NOTE — ED Notes (Addendum)
G2P1; [redacted] weeks pregnant, EDD 04/07/2023. Gestational diabetes. Feels baby move.  2 days of lower abdominal cramping 7/10 intermittently. Pink vaginal discharge. No leaking of fluid.  Sees [facility] at [employer]. Plans to deliver at [facility].

## 2023-02-04 LAB — GC/CHLAMYDIA PROBE AMP (~~LOC~~) NOT AT ARMC
Chlamydia: NEGATIVE
Comment: NEGATIVE
Comment: NORMAL
Neisseria Gonorrhea: NEGATIVE

## 2023-02-05 LAB — URINE CULTURE: Culture: NO GROWTH

## 2023-07-01 ENCOUNTER — Emergency Department (HOSPITAL_BASED_OUTPATIENT_CLINIC_OR_DEPARTMENT_OTHER)
Admission: EM | Admit: 2023-07-01 | Discharge: 2023-07-01 | Disposition: A | Discharge status: Home / Self Care | Attending: Emergency Medicine | Admitting: Emergency Medicine

## 2023-07-01 ENCOUNTER — Other Ambulatory Visit: Payer: Self-pay

## 2023-07-01 DIAGNOSIS — H6691 Otitis media, unspecified, right ear: Secondary | ICD-10-CM | POA: Insufficient documentation

## 2023-07-01 DIAGNOSIS — H9201 Otalgia, right ear: Secondary | ICD-10-CM | POA: Diagnosis present

## 2023-07-01 MED ORDER — IBUPROFEN 400 MG PO TABS
600.0000 mg | ORAL_TABLET | Freq: Once | ORAL | Status: AC
  Administered 2023-07-01: 600 mg via ORAL
  Filled 2023-07-01: qty 1

## 2023-07-01 MED ORDER — AMOXICILLIN 500 MG PO CAPS
500.0000 mg | ORAL_CAPSULE | Freq: Once | ORAL | Status: AC
  Administered 2023-07-01: 500 mg via ORAL
  Filled 2023-07-01: qty 1

## 2023-07-01 MED ORDER — AMOXICILLIN 500 MG PO CAPS: 500.0000 mg | ORAL_CAPSULE | Freq: Three times a day (TID) | ORAL | 0 refills | Status: AC

## 2023-07-01 NOTE — Discharge Instructions (Signed)
 Evaluation today revealed that you likely have a middle ear infection also known as acute otitis media.  Treatment is amoxicillin.  Please take the entire course even if your symptoms are improving.  Recommend you follow-up your PCP.  You can take Tylenol  and ibuprofen  at home for fever control and symptomatic relief.

## 2023-07-01 NOTE — ED Provider Notes (Signed)
 [place] [facility] Provider Note   CSN: [id number] Arrival date & time: 07/01/23  1841     History  Chief Complaint  Patient presents with   Otalgia   HPI Monique Duran is a 23 y.o. female presenting for right ear pain.  Started 2 days ago.  At times pain does radiate to the right side of her head.  Denies hearing loss or tenderness.  Denies nasal congestion or cough.  Unsure if she has had a fever.  She does state that she has had an ear infection in that ear in the past and symptoms are similar.  Denies any abnormal drainage or bleeding from the ear as well.  Has not taken any medications for the pain in the ear.   Otalgia      Home Medications Prior to Admission medications   Medication Sig Start Date End Date Taking? Authorizing Provider  acetaminophen  (TYLENOL ) 325 MG tablet Take 2 tablets (650 mg total) by mouth every 4 (four) hours as needed (for pain scale < 4). 11/25/20   Rogene Claude, MD  ibuprofen  (ADVIL ) 600 MG tablet Take 1 tablet (600 mg total) by mouth every 6 (six) hours. 11/25/20   Rogene Claude, MD  Prenatal MV & Min w/FA-DHA (PRENATAL GUMMIES PO) Take 2 Doses by mouth daily.    [provider]  terconazole  (TERAZOL 7 ) 0.4 % vaginal cream Place 1 applicator vaginally at bedtime. Use for seven days 02/03/23   Tari Fare, CNM      Allergies    Patient has no known allergies.    Review of Systems   Review of Systems  HENT:  Positive for ear pain.     Physical Exam Updated Vital Signs BP 123/74 (BP Location: Right Arm)   Pulse 75   Temp 98.7 F (37.1 C) (Oral)   Resp 18   Wt 49 kg   SpO2 100%   Breastfeeding Yes   BMI 19.13 kg/m  Physical Exam Constitutional:      Appearance: Normal appearance.  HENT:     Head: Normocephalic.     Right Ear: Tenderness present. There is no impacted cerumen. No foreign body. No mastoid tenderness. Tympanic membrane is injected, erythematous and  bulging.     Left Ear: Tympanic membrane, ear canal and external ear normal.     Nose: Nose normal.  Eyes:     Conjunctiva/sclera: Conjunctivae normal.  Pulmonary:     Effort: Pulmonary effort is normal.  Neurological:     Mental Status: She is alert.  Psychiatric:        Mood and Affect: Mood normal.     ED Results / Procedures / Treatments   Labs (all labs ordered are listed, but only abnormal results are displayed) Labs Reviewed - No data to display  EKG None  Radiology No results found.  Procedures Procedures    Medications Ordered in ED Medications  ibuprofen  (ADVIL ) tablet 600 mg (has no administration in time range)  amoxicillin (AMOXIL) capsule 500 mg (has no administration in time range)    ED Course/ Medical Decision Making/ A&P                                 Medical Decision Making  23 year old well-appearing female presenting for right ear pain.  Exam findings concerning for acute otitis media.  Started home on amoxicillin and gave ibuprofen  for pain.  Advised her to follow-up with her PCP.  Sent amoxicillin to her pharmacy.  Also advised Tylenol  and ibuprofen  at home for pain and fever.  Discharged in good condition.        Final Clinical Impression(s) / ED Diagnoses Final diagnoses:  Right otitis media, unspecified otitis media type    Rx / DC Orders ED Discharge Orders     None         Janalee Mcmurray, PA-C 07/01/23 2121    Tonya Fredrickson, MD 07/02/23 1029

## 2023-07-01 NOTE — ED Triage Notes (Signed)
 POV/ presents with right ear pain/ x 2 days/ pt reports itching and pain radiating to head/ hx if same/ pt is A&OX4/ ambulatory
